# Patient Record
Sex: Male | Born: 1965 | Race: White | Hispanic: No | Marital: Married | Smoking: Current every day smoker
Health system: Southern US, Community
[De-identification: ages and names within clinical notes are randomized; demographics above are authoritative.]

## PROBLEM LIST (undated history)

## (undated) DIAGNOSIS — K219 Gastro-esophageal reflux disease without esophagitis: Secondary | ICD-10-CM

## (undated) DIAGNOSIS — M1990 Unspecified osteoarthritis, unspecified site: Secondary | ICD-10-CM

## (undated) DIAGNOSIS — R600 Localized edema: Secondary | ICD-10-CM

## (undated) DIAGNOSIS — F1010 Alcohol abuse, uncomplicated: Secondary | ICD-10-CM

## (undated) DIAGNOSIS — E119 Type 2 diabetes mellitus without complications: Secondary | ICD-10-CM

## (undated) DIAGNOSIS — I10 Essential (primary) hypertension: Secondary | ICD-10-CM

## (undated) DIAGNOSIS — E871 Hypo-osmolality and hyponatremia: Secondary | ICD-10-CM

## (undated) DIAGNOSIS — G4730 Sleep apnea, unspecified: Secondary | ICD-10-CM

## (undated) DIAGNOSIS — Z720 Tobacco use: Secondary | ICD-10-CM

## (undated) DIAGNOSIS — G8929 Other chronic pain: Secondary | ICD-10-CM

## (undated) HISTORY — DX: Other chronic pain: G89.29

## (undated) HISTORY — DX: Hypo-osmolality and hyponatremia: E87.1

## (undated) HISTORY — PX: BACK SURGERY: SHX140

## (undated) HISTORY — DX: Tobacco use: Z72.0

## (undated) HISTORY — DX: Alcohol abuse, uncomplicated: F10.10

## (undated) HISTORY — DX: Localized edema: R60.0

---

## 1998-01-16 ENCOUNTER — Inpatient Hospital Stay (HOSPITAL_COMMUNITY): Admission: EM | Admit: 1998-01-16 | Discharge: 1998-01-18 | Payer: Self-pay | Admitting: Emergency Medicine

## 2001-07-01 ENCOUNTER — Ambulatory Visit (HOSPITAL_BASED_OUTPATIENT_CLINIC_OR_DEPARTMENT_OTHER): Admission: RE | Admit: 2001-07-01 | Discharge: 2001-07-01 | Payer: Self-pay | Admitting: Family Medicine

## 2001-11-12 ENCOUNTER — Encounter: Admission: RE | Admit: 2001-11-12 | Discharge: 2001-11-12 | Payer: Self-pay | Admitting: Family Medicine

## 2001-11-12 ENCOUNTER — Encounter: Payer: Self-pay | Admitting: Family Medicine

## 2003-09-04 ENCOUNTER — Emergency Department (HOSPITAL_COMMUNITY): Admission: EM | Admit: 2003-09-04 | Discharge: 2003-09-04 | Payer: Self-pay

## 2004-04-12 ENCOUNTER — Ambulatory Visit: Payer: Self-pay | Admitting: Family Medicine

## 2004-04-18 ENCOUNTER — Ambulatory Visit: Payer: Self-pay | Admitting: Gastroenterology

## 2004-11-01 ENCOUNTER — Ambulatory Visit: Payer: Self-pay | Admitting: Family Medicine

## 2014-02-23 ENCOUNTER — Other Ambulatory Visit: Payer: Self-pay | Admitting: Neurosurgery

## 2014-02-27 NOTE — Pre-Procedure Instructions (Signed)
Tyler Wood  02/27/2014   Your procedure is scheduled on:    Wednesday  03/01/14  Report to [facility] Admitting at 6:30 AM.  Call this number if you have problems the morning of surgery: [phone]   Remember:   Do not eat food or drink liquids after midnight.   Take these medicines the morning of surgery with A SIP OF WATER:  GABAPENTIN, OXYCODONE IF NEEDED    STOP ASPIRIN, COUMADIN, PLAVIX, EFFIENT, HERBAL MEDICINES, FISH OIL, MULTIVITAMIN   Do not wear jewelry, make-up or nail polish.  Do not wear lotions, powders, or perfumes. You may wear deodorant.  Do not shave 48 hours prior to surgery. Men may shave face and neck.  Do not bring valuables to the hospital.  [facility] is not responsible for any belongings or valuables.               Contacts, dentures or bridgework may not be worn into surgery.  Leave suitcase in the car. After surgery it may be brought to your room.  For patients admitted to the hospital, discharge time is determined by your treatment team.               Special Instructions: [place] - Preparing for Surgery  Before surgery, you can play an important role.  Because skin is not sterile, your skin needs to be as free of germs as possible.  You can reduce the number of germs on you skin by washing with CHG (chlorahexidine gluconate) soap before surgery.  CHG is an antiseptic cleaner which kills germs and bonds with the skin to continue killing germs even after washing.  Please DO NOT use if you have an allergy to CHG or antibacterial soaps.  If your skin becomes reddened/irritated stop using the CHG and inform your nurse when you arrive at Short Stay.  Do not shave (including legs and underarms) for at least 48 hours prior to the first CHG shower.  You may shave your face.  Please follow these instructions carefully:   1.  Shower with CHG Soap the night before surgery and the morning of Surgery.  2.  If you choose to wash your hair, wash  your hair first as usual with your normal shampoo.  3.  After you shampoo, rinse your hair and body thoroughly to remove the shampoo.  4.  Use CHG as you would any other liquid soap.  You can apply CHG directly to the skin and wash gently with scrungie or a clean washcloth.  5.  Apply the CHG Soap to your body ONLY FROM THE NECK DOWN.  Do not use on open wounds or open sores.  Avoid contact with your eyes, ears, mouth and genitals (private parts).  Wash genitals (private parts) with your normal soap.  6.  Wash thoroughly, paying special attention to the area where your surgery will be performed.  7.  Thoroughly rinse your body with warm water from the neck down.  8.  DO NOT shower/wash with your normal soap after using and rinsing off the CHG Soap.  9.  Pat yourself dry with a clean towel.            10.  Wear clean pajamas.            11.  Place clean sheets on your bed the night of your first shower and do not sleep with pets.  Day of Surgery  Do not apply any lotions the  morning of surgery.  Please wear clean clothes to the hospital/surgery center.      Please read over the following fact sheets that you were given: Pain Booklet, Coughing and Deep Breathing, Blood Transfusion Information, MRSA Information and Surgical Site Infection Prevention

## 2014-02-28 ENCOUNTER — Encounter (HOSPITAL_COMMUNITY)
Admission: RE | Admit: 2014-02-28 | Discharge: 2014-02-28 | Disposition: A | Discharge status: Home / Self Care | Payer: 59 | Source: Ambulatory Visit | Attending: Neurosurgery | Admitting: Neurosurgery

## 2014-02-28 ENCOUNTER — Encounter (HOSPITAL_COMMUNITY): Payer: Self-pay

## 2014-02-28 HISTORY — DX: Sleep apnea, unspecified: G47.30

## 2014-02-28 HISTORY — DX: Unspecified osteoarthritis, unspecified site: M19.90

## 2014-02-28 HISTORY — DX: Essential (primary) hypertension: I10

## 2014-02-28 LAB — CBC
HEMATOCRIT: 41.3 % (ref 39.0–52.0)
HEMOGLOBIN: 14.5 g/dL (ref 13.0–17.0)
MCH: 33.4 pg (ref 26.0–34.0)
MCHC: 35.1 g/dL (ref 30.0–36.0)
MCV: 95.2 fL (ref 78.0–100.0)
Platelets: 185 10*3/uL (ref 150–400)
RBC: 4.34 MIL/uL (ref 4.22–5.81)
RDW: 12.6 % (ref 11.5–15.5)
WBC: 7 10*3/uL (ref 4.0–10.5)

## 2014-02-28 LAB — COMPREHENSIVE METABOLIC PANEL
ALBUMIN: 4.3 g/dL (ref 3.5–5.2)
ALT: 39 U/L (ref 0–53)
ANION GAP: 4 — AB (ref 5–15)
AST: 38 U/L — ABNORMAL HIGH (ref 0–37)
Alkaline Phosphatase: 54 U/L (ref 39–117)
BUN: 14 mg/dL (ref 6–23)
CO2: 27 mmol/L (ref 19–32)
CREATININE: 0.91 mg/dL (ref 0.50–1.35)
Calcium: 9.2 mg/dL (ref 8.4–10.5)
Chloride: 104 mmol/L (ref 96–112)
GFR calc non Af Amer: >90 mL/min (ref >90)
Glucose, Bld: 103 mg/dL — ABNORMAL HIGH (ref 70–99)
Potassium: 4.5 mmol/L (ref 3.5–5.1)
Sodium: 135 mmol/L (ref 135–145)
Total Bilirubin: 0.4 mg/dL (ref 0.3–1.2)
Total Protein: 7 g/dL (ref 6.0–8.3)

## 2014-02-28 LAB — TYPE AND SCREEN
ABO/RH(D): A NEG
Antibody Screen: NEGATIVE

## 2014-02-28 LAB — SURGICAL PCR SCREEN
MRSA, PCR: NEGATIVE
Staphylococcus aureus: NEGATIVE

## 2014-02-28 LAB — ABO/RH: ABO/RH(D): A NEG

## 2014-02-28 MED ORDER — CEFAZOLIN SODIUM-DEXTROSE 2-3 GM-% IV SOLR
2.0000 g | INTRAVENOUS | Status: AC
  Administered 2014-03-01 (×2): 2 g via INTRAVENOUS
  Filled 2014-02-28: qty 50

## 2014-02-28 NOTE — Progress Notes (Addendum)
PCP: Dr. Raeanne [facility]iser at [facility], Denies chest pain, shob, denies having a cardiologist or cardiac test, sleep study greater than 3 years ago and was unsure where it was done at.

## 2014-02-28 NOTE — H&P (Signed)
Tyler Wood is an 49 y.o. male.   Chief Complaint: chronic lumbar pain HPI: patient seen in the past with chronic lumbar pain which is getting worse at to the point than the pain is going to the left giving up on him but now he is having weakness on both. Sitting and flexing the spine does help him  Past Medical History  Diagnosis Date  . Hypertension   . Sleep apnea     Uses CPAP  . Arthritis     back    Past Surgical History  Procedure Laterality Date  . No past surgeries      No family history on file. Social History:  reports that he has been smoking.  He has never used smokeless tobacco. He reports that he drinks about 8.4 oz of alcohol per week. He reports that he does not use illicit drugs.  Allergies: No Known Allergies  No prescriptions prior to admission    Results for orders placed or performed during the hospital encounter of 02/28/14 (from the past 48 hour(s))  Surgical pcr screen     Status: None   Collection Time: 02/28/14  9:41 AM  Result Value Ref Range   MRSA, PCR NEGATIVE NEGATIVE   Staphylococcus aureus NEGATIVE NEGATIVE    Comment:        The Xpert SA Assay (FDA approved for NASAL specimens in patients over 39 years of age), is one component of a comprehensive surveillance program.  Test performance has been validated by [facility] for patients greater than or equal to 21 year old. It is not intended to diagnose infection nor to guide or monitor treatment.   Comprehensive metabolic panel     Status: Abnormal   Collection Time: 02/28/14  9:41 AM  Result Value Ref Range   Sodium 135 135 - 145 mmol/L   Potassium 4.5 3.5 - 5.1 mmol/L   Chloride 104 96 - 112 mmol/L   CO2 27 19 - 32 mmol/L   Glucose, Bld 103 (H) 70 - 99 mg/dL   BUN 14 6 - 23 mg/dL   Creatinine, Ser 0.91 0.50 - 1.35 mg/dL   Calcium 9.2 8.4 - 10.5 mg/dL   Total Protein 7.0 6.0 - 8.3 g/dL   Albumin 4.3 3.5 - 5.2 g/dL   AST 38 (H) 0 - 37 U/L   ALT 39 0 - 53 U/L   Alkaline  Phosphatase 54 39 - 117 U/L   Total Bilirubin 0.4 0.3 - 1.2 mg/dL   GFR calc non Af Amer >90 >90 mL/min   GFR calc Af Amer >90 >90 mL/min    Comment: (NOTE) The eGFR has been calculated using the CKD EPI equation. This calculation has not been validated in all clinical situations. eGFR's persistently <90 mL/min signify possible Chronic Kidney Disease.    Anion gap 4 (L) 5 - 15  CBC     Status: None   Collection Time: 02/28/14  9:41 AM  Result Value Ref Range   WBC 7.0 4.0 - 10.5 K/uL   RBC 4.34 4.22 - 5.81 MIL/uL   Hemoglobin 14.5 13.0 - 17.0 g/dL   HCT 41.3 39.0 - 52.0 %   MCV 95.2 78.0 - 100.0 fL   MCH 33.4 26.0 - 34.0 pg   MCHC 35.1 30.0 - 36.0 g/dL   RDW 12.6 11.5 - 15.5 %   Platelets 185 150 - 400 K/uL  Type and screen     Status: None   Collection Time: 02/28/14  9:46 AM  Result Value Ref Range   ABO/RH(D) A NEG    Antibody Screen NEG    Sample Expiration 03/14/2014   ABO/Rh     Status: None   Collection Time: 02/28/14  9:46 AM  Result Value Ref Range   ABO/RH(D) A NEG    No results found.  Review of Systems  Constitutional: Negative.   HENT: Negative.   Eyes: Negative.   Respiratory: Negative.   Cardiovascular: Negative.   Gastrointestinal: Negative.   Genitourinary: Negative.   Musculoskeletal: Positive for back pain.  Skin: Negative.   Neurological: Positive for sensory change and focal weakness.  Endo/Heme/Allergies: Negative.   Psychiatric/Behavioral: Negative.     There were no vitals taken for this visit. Physical Exam  Hent, nl. Neck, nl. Cv. Nl. Lungs, clear. Abdomen, soft. Extremities, nl. NEURO  slr POSITIVE AT 30 DEGREES. FEMORAL STRETCH maneuver is positive bilaterally. dtr and sensory, nl. Mri lumbar spine shows worsening of the degenerative disease from l3 to s1 when compared with the mri of 2014. Assessment/Plan Patient to go ahead with decompression and fusion with pedicles screws. He and his wife are aware of risks and  benefits  Chiante Peden M 02/28/2014, 7:04 PM

## 2014-03-01 ENCOUNTER — Encounter (HOSPITAL_COMMUNITY): Payer: Self-pay | Admitting: *Deleted

## 2014-03-01 ENCOUNTER — Inpatient Hospital Stay (HOSPITAL_COMMUNITY): Payer: 59

## 2014-03-01 ENCOUNTER — Encounter (HOSPITAL_COMMUNITY)
Admission: RE | Disposition: A | Discharge status: Home Health | Payer: 59 | Source: Ambulatory Visit | Attending: Neurosurgery

## 2014-03-01 ENCOUNTER — Inpatient Hospital Stay (HOSPITAL_COMMUNITY): Payer: 59 | Admitting: Anesthesiology

## 2014-03-01 ENCOUNTER — Inpatient Hospital Stay (HOSPITAL_COMMUNITY)
Admission: RE | Admit: 2014-03-01 | Discharge: 2014-03-04 | DRG: 460 | Disposition: A | Discharge status: Home Health | Payer: 59 | Source: Ambulatory Visit | Attending: Neurosurgery | Admitting: Neurosurgery

## 2014-03-01 DIAGNOSIS — I10 Essential (primary) hypertension: Secondary | ICD-10-CM | POA: Diagnosis present

## 2014-03-01 DIAGNOSIS — F17210 Nicotine dependence, cigarettes, uncomplicated: Secondary | ICD-10-CM | POA: Diagnosis present

## 2014-03-01 DIAGNOSIS — M5116 Intervertebral disc disorders with radiculopathy, lumbar region: Secondary | ICD-10-CM | POA: Diagnosis present

## 2014-03-01 DIAGNOSIS — M545 Low back pain: Secondary | ICD-10-CM | POA: Diagnosis present

## 2014-03-01 DIAGNOSIS — G4730 Sleep apnea, unspecified: Secondary | ICD-10-CM | POA: Diagnosis present

## 2014-03-01 DIAGNOSIS — Z419 Encounter for procedure for purposes other than remedying health state, unspecified: Secondary | ICD-10-CM

## 2014-03-01 DIAGNOSIS — M5136 Other intervertebral disc degeneration, lumbar region: Secondary | ICD-10-CM | POA: Diagnosis present

## 2014-03-01 DIAGNOSIS — M51369 Other intervertebral disc degeneration, lumbar region without mention of lumbar back pain or lower extremity pain: Secondary | ICD-10-CM | POA: Diagnosis present

## 2014-03-01 LAB — BASIC METABOLIC PANEL
Anion gap: 10 (ref 5–15)
BUN: 14 mg/dL (ref 6–23)
CO2: 21 mmol/L (ref 19–32)
Calcium: 7.7 mg/dL — ABNORMAL LOW (ref 8.4–10.5)
Chloride: 106 mmol/L (ref 96–112)
Creatinine, Ser: 1.39 mg/dL — ABNORMAL HIGH (ref 0.50–1.35)
GFR calc Af Amer: 69 mL/min — ABNORMAL LOW (ref >90)
GFR, EST NON AFRICAN AMERICAN: 59 mL/min — AB (ref >90)
GLUCOSE: 149 mg/dL — AB (ref 70–99)
POTASSIUM: 4.6 mmol/L (ref 3.5–5.1)
Sodium: 137 mmol/L (ref 135–145)

## 2014-03-01 LAB — CBC
HCT: 35.8 % — ABNORMAL LOW (ref 39.0–52.0)
HCT: 29.9 % — ABNORMAL LOW (ref 39.0–52.0)
Hemoglobin: 12 g/dL — ABNORMAL LOW (ref 13.0–17.0)
Hemoglobin: 10 g/dL — ABNORMAL LOW (ref 13.0–17.0)
MCH: 32.8 pg (ref 26.0–34.0)
MCH: 32.6 pg (ref 26.0–34.0)
MCHC: 33.5 g/dL (ref 30.0–36.0)
MCHC: 33.4 g/dL (ref 30.0–36.0)
MCV: 97.8 fL (ref 78.0–100.0)
MCV: 97.4 fL (ref 78.0–100.0)
PLATELETS: 160 10*3/uL (ref 150–400)
PLATELETS: 126 10*3/uL — AB (ref 150–400)
RBC: 3.66 MIL/uL — ABNORMAL LOW (ref 4.22–5.81)
RBC: 3.07 MIL/uL — AB (ref 4.22–5.81)
RDW: 13 % (ref 11.5–15.5)
RDW: 13.1 % (ref 11.5–15.5)
WBC: 16.7 10*3/uL — ABNORMAL HIGH (ref 4.0–10.5)
WBC: 10.9 10*3/uL — ABNORMAL HIGH (ref 4.0–10.5)

## 2014-03-01 LAB — PROTIME-INR
INR: 1.21 (ref 0.00–1.49)
Prothrombin Time: 15.4 seconds — ABNORMAL HIGH (ref 11.6–15.2)

## 2014-03-01 LAB — APTT: aPTT: 31 seconds (ref 24–37)

## 2014-03-01 SURGERY — POSTERIOR LUMBAR FUSION 3 LEVEL
Anesthesia: General | Site: Spine Lumbar

## 2014-03-01 MED ORDER — FENTANYL CITRATE 0.05 MG/ML IJ SOLN
INTRAMUSCULAR | Status: AC
  Filled 2014-03-01: qty 5

## 2014-03-01 MED ORDER — PROPOFOL 10 MG/ML IV BOLUS
INTRAVENOUS | Status: DC | PRN
  Administered 2014-03-01: 260 mg via INTRAVENOUS

## 2014-03-01 MED ORDER — SUFENTANIL CITRATE 50 MCG/ML IV SOLN
INTRAVENOUS | Status: AC
  Filled 2014-03-01: qty 1

## 2014-03-01 MED ORDER — FENTANYL CITRATE 0.05 MG/ML IJ SOLN
INTRAMUSCULAR | Status: DC | PRN
  Administered 2014-03-01: 100 ug via INTRAVENOUS
  Administered 2014-03-01: 50 ug via INTRAVENOUS
  Administered 2014-03-01: 100 ug via INTRAVENOUS

## 2014-03-01 MED ORDER — DIAZEPAM 5 MG PO TABS: 5.0000 mg | ORAL_TABLET | Freq: Four times a day (QID) | ORAL | Status: DC | PRN

## 2014-03-01 MED ORDER — VANCOMYCIN HCL 1000 MG IV SOLR
INTRAVENOUS | Status: AC
Start: 2014-03-01 — End: 2014-03-02
  Filled 2014-03-01: qty 1000

## 2014-03-01 MED ORDER — DIPHENHYDRAMINE HCL 50 MG/ML IJ SOLN: 12.5000 mg | Freq: Four times a day (QID) | INTRAMUSCULAR | Status: DC | PRN

## 2014-03-01 MED ORDER — MENTHOL 3 MG MT LOZG: 1.0000 | LOZENGE | OROMUCOSAL | Status: DC | PRN

## 2014-03-01 MED ORDER — SODIUM CHLORIDE 0.9 % IV SOLN
INTRAVENOUS | Status: DC | PRN
  Administered 2014-03-01: 13:00:00 via INTRAVENOUS

## 2014-03-01 MED ORDER — ROCURONIUM BROMIDE 50 MG/5ML IV SOLN
INTRAVENOUS | Status: AC
  Filled 2014-03-01: qty 1

## 2014-03-01 MED ORDER — HYDROMORPHONE HCL 1 MG/ML IJ SOLN
INTRAMUSCULAR | Status: AC
  Filled 2014-03-01: qty 1

## 2014-03-01 MED ORDER — EPHEDRINE SULFATE 50 MG/ML IJ SOLN
INTRAMUSCULAR | Status: AC
Start: 2014-03-01 — End: 2014-03-01
  Filled 2014-03-01: qty 1

## 2014-03-01 MED ORDER — SODIUM CHLORIDE 0.9 % IJ SOLN: 9.0000 mL | INTRAMUSCULAR | Status: DC | PRN

## 2014-03-01 MED ORDER — SODIUM CHLORIDE 0.9 % IV SOLN
INTRAVENOUS | Status: DC
  Administered 2014-03-01 – 2014-03-02 (×3): via INTRAVENOUS

## 2014-03-01 MED ORDER — PHENYLEPHRINE HCL 10 MG/ML IJ SOLN
INTRAMUSCULAR | Status: DC | PRN
  Administered 2014-03-01 (×3): 80 ug via INTRAVENOUS

## 2014-03-01 MED ORDER — BACITRACIN ZINC 500 UNIT/GM EX OINT
TOPICAL_OINTMENT | CUTANEOUS | Status: DC | PRN
  Administered 2014-03-01: 1 via TOPICAL

## 2014-03-01 MED ORDER — LIDOCAINE HCL (CARDIAC) 20 MG/ML IV SOLN
INTRAVENOUS | Status: DC | PRN
  Administered 2014-03-01: 60 mg via INTRAVENOUS

## 2014-03-01 MED ORDER — LACTATED RINGERS IV SOLN
INTRAVENOUS | Status: DC | PRN
  Administered 2014-03-01 (×3): via INTRAVENOUS

## 2014-03-01 MED ORDER — MORPHINE SULFATE (PF) 1 MG/ML IV SOLN
INTRAVENOUS | Status: AC
  Filled 2014-03-01: qty 25

## 2014-03-01 MED ORDER — THROMBIN 20000 UNITS EX SOLR
CUTANEOUS | Status: DC | PRN
  Administered 2014-03-01 (×2): 20 mL via TOPICAL

## 2014-03-01 MED ORDER — ONDANSETRON HCL 4 MG/2ML IJ SOLN: 4.0000 mg | INTRAMUSCULAR | Status: DC | PRN

## 2014-03-01 MED ORDER — PROPOFOL 10 MG/ML IV BOLUS
INTRAVENOUS | Status: AC
  Filled 2014-03-01: qty 20

## 2014-03-01 MED ORDER — SODIUM CHLORIDE 0.9 % IJ SOLN
3.0000 mL | Freq: Two times a day (BID) | INTRAMUSCULAR | Status: DC
  Administered 2014-03-01 – 2014-03-03 (×4): 3 mL via INTRAVENOUS

## 2014-03-01 MED ORDER — DIPHENHYDRAMINE HCL 12.5 MG/5ML PO ELIX: 12.5000 mg | ORAL_SOLUTION | Freq: Four times a day (QID) | ORAL | Status: DC | PRN

## 2014-03-01 MED ORDER — ONDANSETRON HCL 4 MG/2ML IJ SOLN
INTRAMUSCULAR | Status: AC
  Filled 2014-03-01: qty 2

## 2014-03-01 MED ORDER — OXYCODONE-ACETAMINOPHEN 5-325 MG PO TABS
ORAL_TABLET | ORAL | Status: AC
  Administered 2014-03-01: 2 via ORAL
  Filled 2014-03-01: qty 2

## 2014-03-01 MED ORDER — SUCCINYLCHOLINE CHLORIDE 20 MG/ML IJ SOLN
INTRAMUSCULAR | Status: AC
  Filled 2014-03-01: qty 1

## 2014-03-01 MED ORDER — DIAZEPAM 5 MG/ML IJ SOLN
INTRAMUSCULAR | Status: AC
  Administered 2014-03-01: 2.5 mg via INTRAVENOUS
  Filled 2014-03-01: qty 2

## 2014-03-01 MED ORDER — ALBUMIN HUMAN 5 % IV SOLN
INTRAVENOUS | Status: DC | PRN
  Administered 2014-03-01 (×4): via INTRAVENOUS

## 2014-03-01 MED ORDER — SODIUM CHLORIDE 0.9 % IJ SOLN
INTRAMUSCULAR | Status: AC
  Filled 2014-03-01: qty 10

## 2014-03-01 MED ORDER — MORPHINE SULFATE (PF) 1 MG/ML IV SOLN
INTRAVENOUS | Status: DC
  Administered 2014-03-01: 16:00:00 via INTRAVENOUS
  Administered 2014-03-01: 12.23 mg via INTRAVENOUS
  Administered 2014-03-02: 12 mg via INTRAVENOUS
  Administered 2014-03-02: 12.23 mg via INTRAVENOUS
  Administered 2014-03-02: 15 mg via INTRAVENOUS
  Administered 2014-03-02: 16:00:00 via INTRAVENOUS
  Administered 2014-03-02: 12.23 mg via INTRAVENOUS
  Administered 2014-03-02: 6 mg via INTRAVENOUS
  Administered 2014-03-03: 06:00:00 via INTRAVENOUS
  Administered 2014-03-03: 6 mg via INTRAVENOUS
  Administered 2014-03-03: 7.5 mg via INTRAVENOUS
  Filled 2014-03-01 (×5): qty 25

## 2014-03-01 MED ORDER — SODIUM CHLORIDE 0.9 % IV SOLN: 4.0000 ug/kg/min | INTRAVENOUS | Status: DC

## 2014-03-01 MED ORDER — VANCOMYCIN HCL 1000 MG IV SOLR
INTRAVENOUS | Status: DC | PRN
  Administered 2014-03-01: 1000 mg via TOPICAL

## 2014-03-01 MED ORDER — ONDANSETRON HCL 4 MG/2ML IJ SOLN
INTRAMUSCULAR | Status: DC | PRN
  Administered 2014-03-01: 4 mg via INTRAVENOUS

## 2014-03-01 MED ORDER — BUPIVACAINE LIPOSOME 1.3 % IJ SUSP
INTRAMUSCULAR | Status: DC | PRN
  Administered 2014-03-01: 20 mL

## 2014-03-01 MED ORDER — ROCURONIUM BROMIDE 100 MG/10ML IV SOLN
INTRAVENOUS | Status: DC | PRN
  Administered 2014-03-01: 20 mg via INTRAVENOUS
  Administered 2014-03-01 (×6): 10 mg via INTRAVENOUS
  Administered 2014-03-01: 20 mg via INTRAVENOUS
  Administered 2014-03-01: 50 mg via INTRAVENOUS
  Administered 2014-03-01: 20 mg via INTRAVENOUS
  Administered 2014-03-01 (×2): 10 mg via INTRAVENOUS

## 2014-03-01 MED ORDER — DIAZEPAM 5 MG/ML IJ SOLN
2.5000 mg | INTRAMUSCULAR | Status: AC | PRN
  Administered 2014-03-01 (×2): 2.5 mg via INTRAVENOUS

## 2014-03-01 MED ORDER — SODIUM CHLORIDE 0.9 % IV SOLN
4.0000 ug/kg/min | INTRAVENOUS | Status: DC
  Filled 2014-03-01: qty 50

## 2014-03-01 MED ORDER — ACETAMINOPHEN 325 MG PO TABS
650.0000 mg | ORAL_TABLET | ORAL | Status: DC | PRN
  Administered 2014-03-02 – 2014-03-03 (×2): 650 mg via ORAL
  Filled 2014-03-01 (×2): qty 2

## 2014-03-01 MED ORDER — DIAZEPAM 5 MG PO TABS
10.0000 mg | ORAL_TABLET | Freq: Four times a day (QID) | ORAL | Status: DC | PRN
  Administered 2014-03-02 – 2014-03-04 (×5): 10 mg via ORAL
  Filled 2014-03-01 (×5): qty 2

## 2014-03-01 MED ORDER — HYDROMORPHONE HCL 1 MG/ML IJ SOLN: 0.2500 mg | INTRAMUSCULAR | Status: DC | PRN

## 2014-03-01 MED ORDER — PROMETHAZINE HCL 25 MG/ML IJ SOLN: 6.2500 mg | INTRAMUSCULAR | Status: DC | PRN

## 2014-03-01 MED ORDER — NALOXONE HCL 0.4 MG/ML IJ SOLN: 0.4000 mg | INTRAMUSCULAR | Status: DC | PRN

## 2014-03-01 MED ORDER — MIDAZOLAM HCL 2 MG/2ML IJ SOLN
INTRAMUSCULAR | Status: AC
  Filled 2014-03-01: qty 2

## 2014-03-01 MED ORDER — 0.9 % SODIUM CHLORIDE (POUR BTL) OPTIME
TOPICAL | Status: DC | PRN
  Administered 2014-03-01 (×2): 1000 mL

## 2014-03-01 MED ORDER — GABAPENTIN 600 MG PO TABS
600.0000 mg | ORAL_TABLET | Freq: Three times a day (TID) | ORAL | Status: DC
  Administered 2014-03-01 – 2014-03-04 (×8): 600 mg via ORAL
  Filled 2014-03-01 (×9): qty 1

## 2014-03-01 MED ORDER — SODIUM CHLORIDE 0.9 % IJ SOLN: 3.0000 mL | INTRAMUSCULAR | Status: DC | PRN

## 2014-03-01 MED ORDER — CEFAZOLIN SODIUM 1-5 GM-% IV SOLN
1.0000 g | Freq: Three times a day (TID) | INTRAVENOUS | Status: AC
  Administered 2014-03-01 – 2014-03-02 (×2): 1 g via INTRAVENOUS
  Filled 2014-03-01 (×3): qty 50

## 2014-03-01 MED ORDER — SODIUM CHLORIDE 0.9 % IV SOLN
250.0000 mL | INTRAVENOUS | Status: DC
  Administered 2014-03-01: 250 mL via INTRAVENOUS

## 2014-03-01 MED ORDER — NEOSTIGMINE METHYLSULFATE 10 MG/10ML IV SOLN
INTRAVENOUS | Status: AC
  Filled 2014-03-01: qty 1

## 2014-03-01 MED ORDER — MIDAZOLAM HCL 5 MG/5ML IJ SOLN
INTRAMUSCULAR | Status: DC | PRN
  Administered 2014-03-01: 1 mg via INTRAVENOUS
  Administered 2014-03-01: 2 mg via INTRAVENOUS
  Administered 2014-03-01: 1 mg via INTRAVENOUS

## 2014-03-01 MED ORDER — PHENYLEPHRINE HCL 10 MG/ML IJ SOLN
10.0000 mg | INTRAVENOUS | Status: DC | PRN
  Administered 2014-03-01: 20 ug/min via INTRAVENOUS

## 2014-03-01 MED ORDER — ONDANSETRON HCL 4 MG/2ML IJ SOLN: 4.0000 mg | Freq: Four times a day (QID) | INTRAMUSCULAR | Status: DC | PRN

## 2014-03-01 MED ORDER — NEOSTIGMINE METHYLSULFATE 10 MG/10ML IV SOLN
INTRAVENOUS | Status: DC | PRN
  Administered 2014-03-01: 3 mg via INTRAVENOUS

## 2014-03-01 MED ORDER — ROCURONIUM BROMIDE 50 MG/5ML IV SOLN
INTRAVENOUS | Status: AC
  Filled 2014-03-01: qty 2

## 2014-03-01 MED ORDER — GLYCOPYRROLATE 0.2 MG/ML IJ SOLN
INTRAMUSCULAR | Status: DC | PRN
  Administered 2014-03-01: 0.2 mg via INTRAVENOUS

## 2014-03-01 MED ORDER — SUFENTANIL CITRATE 50 MCG/ML IV SOLN
INTRAVENOUS | Status: DC | PRN
  Administered 2014-03-01: 10 ug via INTRAVENOUS
  Administered 2014-03-01: 5 ug via INTRAVENOUS
  Administered 2014-03-01 (×2): 10 ug via INTRAVENOUS
  Administered 2014-03-01: 20 ug via INTRAVENOUS
  Administered 2014-03-01 (×2): 10 ug via INTRAVENOUS
  Administered 2014-03-01 (×2): 5 ug via INTRAVENOUS
  Administered 2014-03-01 (×3): 10 ug via INTRAVENOUS
  Administered 2014-03-01 (×2): 5 ug via INTRAVENOUS
  Administered 2014-03-01 (×4): 10 ug via INTRAVENOUS
  Administered 2014-03-01: 5 ug via INTRAVENOUS
  Administered 2014-03-01 (×2): 10 ug via INTRAVENOUS
  Administered 2014-03-01 (×2): 5 ug via INTRAVENOUS

## 2014-03-01 MED ORDER — FUROSEMIDE 10 MG/ML IJ SOLN
INTRAMUSCULAR | Status: DC | PRN
  Administered 2014-03-01: 10 mg via INTRAMUSCULAR
  Administered 2014-03-01: 20 mg via INTRAMUSCULAR
  Administered 2014-03-01: 10 mg via INTRAMUSCULAR

## 2014-03-01 MED ORDER — OXYCODONE-ACETAMINOPHEN 5-325 MG PO TABS
2.0000 | ORAL_TABLET | ORAL | Status: DC | PRN
  Administered 2014-03-01 – 2014-03-03 (×6): 2 via ORAL
  Filled 2014-03-01 (×6): qty 2

## 2014-03-01 MED ORDER — BUPIVACAINE LIPOSOME 1.3 % IJ SUSP
20.0000 mL | INTRAMUSCULAR | Status: AC
  Filled 2014-03-01: qty 20

## 2014-03-01 MED ORDER — GLYCOPYRROLATE 0.2 MG/ML IJ SOLN
INTRAMUSCULAR | Status: AC
  Filled 2014-03-01: qty 2

## 2014-03-01 MED ORDER — PHENOL 1.4 % MT LIQD: 1.0000 | OROMUCOSAL | Status: DC | PRN

## 2014-03-01 MED ORDER — PHENYLEPHRINE 40 MCG/ML (10ML) SYRINGE FOR IV PUSH (FOR BLOOD PRESSURE SUPPORT)
PREFILLED_SYRINGE | INTRAVENOUS | Status: AC
  Filled 2014-03-01: qty 10

## 2014-03-01 MED ORDER — ACETAMINOPHEN 650 MG RE SUPP: 650.0000 mg | RECTAL | Status: DC | PRN

## 2014-03-01 MED ORDER — LIDOCAINE HCL (CARDIAC) 20 MG/ML IV SOLN
INTRAVENOUS | Status: AC
  Filled 2014-03-01: qty 5

## 2014-03-01 MED ORDER — ARTIFICIAL TEARS OP OINT
TOPICAL_OINTMENT | OPHTHALMIC | Status: AC
  Filled 2014-03-01: qty 3.5

## 2014-03-01 MED ORDER — LISINOPRIL 20 MG PO TABS
20.0000 mg | ORAL_TABLET | Freq: Every day | ORAL | Status: DC
  Administered 2014-03-03 – 2014-03-04 (×2): 20 mg via ORAL
  Filled 2014-03-01 (×3): qty 1

## 2014-03-01 MED ORDER — FUROSEMIDE 10 MG/ML IJ SOLN
INTRAMUSCULAR | Status: AC
  Filled 2014-03-01: qty 4

## 2014-03-01 SURGICAL SUPPLY — 77 items
APL SKNCLS STERI-STRIP NONHPOA (GAUZE/BANDAGES/DRESSINGS)
BENZOIN TINCTURE PRP APPL 2/3 (GAUZE/BANDAGES/DRESSINGS) IMPLANT
BLADE BN FN 3.2XSTRL LF (MISCELLANEOUS) ×1 IMPLANT
BLADE BONE MILL FINE (MISCELLANEOUS) ×2
BLADE CLIPPER SURG (BLADE) IMPLANT
BUR ACORN 6.0 (BURR) ×2 IMPLANT
BUR MATCHSTICK NEURO 3.0 LAGG (BURR) ×2 IMPLANT
CANISTER SUCT 3000ML PPV (MISCELLANEOUS) ×2 IMPLANT
CAP LOCKING THREADED (Cap) ×16 IMPLANT
CONT SPEC 4OZ CLIKSEAL STRL BL (MISCELLANEOUS) ×4 IMPLANT
CORDS BIPOLAR (ELECTRODE) ×2 IMPLANT
COVER BACK TABLE 60X90IN (DRAPES) ×2 IMPLANT
DRAPE C-ARM 42X72 X-RAY (DRAPES) ×8 IMPLANT
DRAPE LAPAROTOMY 100X72X124 (DRAPES) ×2 IMPLANT
DRAPE POUCH INSTRU U-SHP 10X18 (DRAPES) ×2 IMPLANT
DRSG PAD ABDOMINAL 8X10 ST (GAUZE/BANDAGES/DRESSINGS) ×2 IMPLANT
DURAPREP 26ML APPLICATOR (WOUND CARE) ×2 IMPLANT
ELECT REM PT RETURN 9FT ADLT (ELECTROSURGICAL) ×2
ELECTRODE REM PT RTRN 9FT ADLT (ELECTROSURGICAL) ×1 IMPLANT
EVACUATOR 3/16  PVC DRAIN (DRAIN) ×1
EVACUATOR 3/16 PVC DRAIN (DRAIN) ×1 IMPLANT
GAUZE SPONGE 4X4 12PLY STRL (GAUZE/BANDAGES/DRESSINGS) ×2 IMPLANT
GAUZE SPONGE 4X4 16PLY XRAY LF (GAUZE/BANDAGES/DRESSINGS) ×4 IMPLANT
GLOVE BIO SURGEON STRL SZ7 (GLOVE) ×4 IMPLANT
GLOVE BIO SURGEON STRL SZ8.5 (GLOVE) ×6 IMPLANT
GLOVE BIOGEL M 8.0 STRL (GLOVE) ×4 IMPLANT
GLOVE BIOGEL PI IND STRL 7.5 (GLOVE) ×2 IMPLANT
GLOVE BIOGEL PI IND STRL 8 (GLOVE) ×2 IMPLANT
GLOVE BIOGEL PI INDICATOR 7.5 (GLOVE) ×2
GLOVE BIOGEL PI INDICATOR 8 (GLOVE) ×2
GLOVE ECLIPSE 7.5 STRL STRAW (GLOVE) ×8 IMPLANT
GLOVE EXAM NITRILE LRG STRL (GLOVE) IMPLANT
GLOVE EXAM NITRILE MD LF STRL (GLOVE) IMPLANT
GLOVE EXAM NITRILE XL STR (GLOVE) IMPLANT
GLOVE EXAM NITRILE XS STR PU (GLOVE) IMPLANT
GLOVE SS BIOGEL STRL SZ 8 (GLOVE) ×4 IMPLANT
GLOVE SUPERSENSE BIOGEL SZ 8 (GLOVE) ×4
GOWN STRL REUS W/ TWL LRG LVL3 (GOWN DISPOSABLE) ×1 IMPLANT
GOWN STRL REUS W/ TWL XL LVL3 (GOWN DISPOSABLE) ×6 IMPLANT
GOWN STRL REUS W/TWL 2XL LVL3 (GOWN DISPOSABLE) ×4 IMPLANT
GOWN STRL REUS W/TWL LRG LVL3 (GOWN DISPOSABLE) ×2
GOWN STRL REUS W/TWL XL LVL3 (GOWN DISPOSABLE) ×12
KIT BASIN OR (CUSTOM PROCEDURE TRAY) ×2 IMPLANT
KIT INFUSE LRG II (Orthopedic Implant) ×2 IMPLANT
KIT ROOM TURNOVER OR (KITS) ×2 IMPLANT
NEEDLE HYPO 18GX1.5 BLUNT FILL (NEEDLE) IMPLANT
NEEDLE HYPO 21X1.5 SAFETY (NEEDLE) ×2 IMPLANT
NEEDLE HYPO 25X1 1.5 SAFETY (NEEDLE) ×2 IMPLANT
NS IRRIG 1000ML POUR BTL (IV SOLUTION) ×4 IMPLANT
PACK LAMINECTOMY NEURO (CUSTOM PROCEDURE TRAY) ×2 IMPLANT
PAD ARMBOARD 7.5X6 YLW CONV (MISCELLANEOUS) ×10 IMPLANT
PATTIES SURGICAL .5 X1 (DISPOSABLE) ×2 IMPLANT
PATTIES SURGICAL .5 X3 (DISPOSABLE) IMPLANT
PATTIES SURGICAL 1X1 (DISPOSABLE) ×2 IMPLANT
ROD CREO 100MM SPINAL (Rod) ×4 IMPLANT
SCREW 6.5X45 (Screw) ×14 IMPLANT
SCREW CREO THRD POLY 6.5X55MM (Screw) ×2 IMPLANT
SPACER CROSSLINK 58-70 (Spacer) ×2 IMPLANT
SPACER RISE 8X22 11-17MM-15 (Spacer) ×4 IMPLANT
SPACER RISE 8X22 8-14MM-10 (Neuro Prosthesis/Implant) ×4 IMPLANT
SPONGE LAP 4X18 X RAY DECT (DISPOSABLE) IMPLANT
SPONGE NEURO XRAY DETECT 1X3 (DISPOSABLE) IMPLANT
SPONGE SURGIFOAM ABS GEL 100 (HEMOSTASIS) ×4 IMPLANT
STRIP CLOSURE SKIN 1/2X4 (GAUZE/BANDAGES/DRESSINGS) IMPLANT
SUT VIC AB 1 CT1 18XBRD ANBCTR (SUTURE) ×2 IMPLANT
SUT VIC AB 1 CT1 8-18 (SUTURE) ×4
SUT VIC AB 2-0 CP2 18 (SUTURE) ×4 IMPLANT
SUT VIC AB 3-0 SH 8-18 (SUTURE) ×4 IMPLANT
SYR 20CC LL (SYRINGE) ×2 IMPLANT
SYR 20ML ECCENTRIC (SYRINGE) ×2 IMPLANT
SYR 5ML LL (SYRINGE) IMPLANT
TAPE CLOTH SURG 4X10 WHT LF (GAUZE/BANDAGES/DRESSINGS) ×2 IMPLANT
TOWEL OR 17X24 6PK STRL BLUE (TOWEL DISPOSABLE) ×2 IMPLANT
TOWEL OR 17X26 10 PK STRL BLUE (TOWEL DISPOSABLE) ×2 IMPLANT
TRAY FOLEY CATH 14FRSI W/METER (CATHETERS) IMPLANT
TRAY FOLEY CATH 16FRSI W/METER (SET/KITS/TRAYS/PACK) ×2 IMPLANT
WATER STERILE IRR 1000ML POUR (IV SOLUTION) ×2 IMPLANT

## 2014-03-01 NOTE — Progress Notes (Signed)
Called MD about BP 80/50 and gave orders for STAT CBC and 1 liter bolus over 3 hours. Will continue to monitor. Suzy Bouchardhompson, Joseth Weigel E, [place]RN  03/01/2014 2130

## 2014-03-01 NOTE — Transfer of Care (Signed)
Immediate Anesthesia Transfer of Care Note  Patient: Tyler Wood  Procedure(s) Performed: Procedure(s): LUMBAR THREE-FOUR,LUMBAR FOUR-FIVE,LUMBARFIVE-SACRAL ONE DECOMPRESSION/FUSION (N/A)  Patient Location: PACU  Anesthesia Type:General  Level of Consciousness: awake, oriented and patient cooperative  Airway & Oxygen Therapy: Patient Spontanous Breathing and Patient connected to face mask oxygen  Post-op Assessment: Report given to RN, Post -op Vital signs reviewed and stable and Patient moving all extremities  Post vital signs: Reviewed and stable  Last Vitals:  Filed Vitals:   03/01/14 0637  BP: 146/89  Pulse: 83  Temp: 36.1 C    Complications: No apparent anesthesia complications

## 2014-03-01 NOTE — Anesthesia Preprocedure Evaluation (Addendum)
Anesthesia Evaluation  Patient identified by MRN, date of birth, ID band Patient awake    Reviewed: Allergy & Precautions, NPO status , Patient's Chart, lab work & pertinent test results  Airway Mallampati: II  TM Distance: >3 FB Neck ROM: Full    Dental   Pulmonary sleep apnea , Current Smoker,  breath sounds clear to auscultation        Cardiovascular hypertension, Rhythm:Regular Rate:Normal     Neuro/Psych    GI/Hepatic negative GI ROS, Neg liver ROS,   Endo/Other  negative endocrine ROS  Renal/GU negative Renal ROS     Musculoskeletal  (+) Arthritis -,   Abdominal   Peds  Hematology   Anesthesia Other Findings   Reproductive/Obstetrics                            Anesthesia Physical Anesthesia Plan  ASA: III  Anesthesia Plan: General   Post-op Pain Management:    Induction: Intravenous  Airway Management Planned: Oral ETT  Additional Equipment:   Intra-op Plan:   Post-operative Plan: Extubation in OR  Informed Consent: I have reviewed the patients History and Physical, chart, labs and discussed the procedure including the risks, benefits and alternatives for the proposed anesthesia with the patient or authorized representative who has indicated his/her understanding and acceptance.   Dental advisory given  Plan Discussed with: CRNA, Anesthesiologist and Surgeon  Anesthesia Plan Comments:        Anesthesia Quick Evaluation

## 2014-03-01 NOTE — Progress Notes (Signed)
Patient ID: Tyler Wood, male   DOB: 1965/03/01, 49 y.o.   MRN: [id number] C/o incisional pain. No weakness . To get a cbc in am

## 2014-03-01 NOTE — Progress Notes (Signed)
Called MD back about BP 89/59 after 1 liter bolus and since CBC checked out okay that he said to continue to monitor. Melina Schoolshompson, Akhil Piscopo E, RN 03/01/2014 11:42 PM

## 2014-03-01 NOTE — Anesthesia Procedure Notes (Signed)
Procedure Name: Intubation Date/Time: 03/01/2014 8:40 AM Performed by: Coralee RudFLORES, Doralene Glanz Pre-anesthesia Checklist: Patient identified, Emergency Drugs available, Suction available, Patient being monitored and Timeout performed Patient Re-evaluated:Patient Re-evaluated prior to inductionOxygen Delivery Method: Circle system utilized Preoxygenation: Pre-oxygenation with 100% oxygen Intubation Type: IV induction Laryngoscope Size: Miller and 3 Grade View: Grade III Tube type: Oral Tube size: 8.0 mm Number of attempts: 1 Airway Equipment and Method: Stylet Placement Confirmation: ETT inserted through vocal cords under direct vision,  positive ETCO2 and breath sounds checked- equal and bilateral Secured at: 23 cm Tube secured with: Tape Dental Injury: Teeth and Oropharynx as per pre-operative assessment  Future Recommendations: Recommend- induction with short-acting agent, and alternative techniques readily available

## 2014-03-01 NOTE — Anesthesia Postprocedure Evaluation (Signed)
  Anesthesia Post-op Note  Patient: Tyler Wood  Procedure(s) Performed: Procedure(s): LUMBAR THREE-FOUR,LUMBAR FOUR-FIVE,LUMBARFIVE-SACRAL ONE DECOMPRESSION/FUSION (N/A)  Patient Location: PACU  Anesthesia Type:General  Level of Consciousness: awake  Airway and Oxygen Therapy: Patient Spontanous Breathing  Post-op Pain: mild  Post-op Assessment: Post-op Vital signs reviewed  Post-op Vital Signs: Reviewed  Last Vitals:  Filed Vitals:   03/01/14 0637  BP: 146/89  Pulse: 83  Temp: 36.1 C    Complications: No apparent anesthesia complications

## 2014-03-02 LAB — POCT I-STAT 4, (NA,K, GLUC, HGB,HCT)
GLUCOSE: 121 mg/dL — AB (ref 70–99)
Glucose, Bld: 132 mg/dL — ABNORMAL HIGH (ref 70–99)
Glucose, Bld: 123 mg/dL — ABNORMAL HIGH (ref 70–99)
HCT: 34 % — ABNORMAL LOW (ref 39.0–52.0)
HCT: 33 % — ABNORMAL LOW (ref 39.0–52.0)
HEMATOCRIT: 36 % — AB (ref 39.0–52.0)
Hemoglobin: 12.2 g/dL — ABNORMAL LOW (ref 13.0–17.0)
Hemoglobin: 11.6 g/dL — ABNORMAL LOW (ref 13.0–17.0)
Hemoglobin: 11.2 g/dL — ABNORMAL LOW (ref 13.0–17.0)
POTASSIUM: 6.5 mmol/L — AB (ref 3.5–5.1)
POTASSIUM: 6.4 mmol/L — AB (ref 3.5–5.1)
Potassium: 6.5 mmol/L (ref 3.5–5.1)
SODIUM: 136 mmol/L (ref 135–145)
SODIUM: 136 mmol/L (ref 135–145)
Sodium: 135 mmol/L (ref 135–145)

## 2014-03-02 LAB — CBC WITH DIFFERENTIAL/PLATELET
BASOS PCT: 0 % (ref 0–1)
Basophils Absolute: 0 10*3/uL (ref 0.0–0.1)
EOS PCT: 0 % (ref 0–5)
Eosinophils Absolute: 0 10*3/uL (ref 0.0–0.7)
HCT: 28.7 % — ABNORMAL LOW (ref 39.0–52.0)
Hemoglobin: 9.7 g/dL — ABNORMAL LOW (ref 13.0–17.0)
Lymphocytes Relative: 12 % (ref 12–46)
Lymphs Abs: 1.5 10*3/uL (ref 0.7–4.0)
MCH: 32.8 pg (ref 26.0–34.0)
MCHC: 33.8 g/dL (ref 30.0–36.0)
MCV: 97 fL (ref 78.0–100.0)
MONOS PCT: 14 % — AB (ref 3–12)
Monocytes Absolute: 1.8 10*3/uL — ABNORMAL HIGH (ref 0.1–1.0)
NEUTROS PCT: 74 % (ref 43–77)
Neutro Abs: 9.5 10*3/uL — ABNORMAL HIGH (ref 1.7–7.7)
Platelets: 116 10*3/uL — ABNORMAL LOW (ref 150–400)
RBC: 2.96 MIL/uL — ABNORMAL LOW (ref 4.22–5.81)
RDW: 13 % (ref 11.5–15.5)
WBC: 12.8 10*3/uL — ABNORMAL HIGH (ref 4.0–10.5)

## 2014-03-02 MED ORDER — POLYETHYLENE GLYCOL 3350 17 G PO PACK: 17.0000 g | PACK | Freq: Every day | ORAL | Status: DC | PRN

## 2014-03-02 MED ORDER — SENNA 8.6 MG PO TABS
1.0000 | ORAL_TABLET | Freq: Two times a day (BID) | ORAL | Status: DC
  Administered 2014-03-02 – 2014-03-04 (×5): 8.6 mg via ORAL
  Filled 2014-03-02 (×5): qty 1

## 2014-03-02 MED ORDER — DOCUSATE SODIUM 100 MG PO CAPS
100.0000 mg | ORAL_CAPSULE | Freq: Two times a day (BID) | ORAL | Status: DC
  Administered 2014-03-02 – 2014-03-04 (×5): 100 mg via ORAL
  Filled 2014-03-02 (×5): qty 1

## 2014-03-02 MED FILL — Heparin Sodium (Porcine) Inj 1000 Unit/ML: INTRAMUSCULAR | Qty: 30 | Status: AC

## 2014-03-02 MED FILL — Sodium Chloride IV Soln 0.9%: INTRAVENOUS | Qty: 3000 | Status: AC

## 2014-03-02 NOTE — Progress Notes (Signed)
Patient ID: Tyler Wood, male   DOB: [birth year], 49 y.o.   MRN: [id number] Stable, slept off and on. No weakness. Pt /ot tohelp. Continue in analgesia.

## 2014-03-02 NOTE — Evaluation (Signed)
Occupational Therapy Evaluation Patient Details Name: RAYSON RANDO MRN: [id number] DOB: [birth year] Today's Date: 03/02/2014    History of Present Illness Patient is a 49 y/o male s/p L3-4, 4-5, L5-S1 decompression/fusion.    Clinical Impression   Pt admitted with the above diagnosis and has the deficits listed below. Pt would benefit from cont OT to increase safety and I with adls and adl mobility so he can d/c home with his wife.     Follow Up Recommendations  Home health OT;Supervision/Assistance - 24 hour    Equipment Recommendations  3 in 1 bedside comode    Recommendations for Other Services       Precautions / Restrictions Precautions Precautions: Back;Fall Precaution Booklet Issued: No Precaution Comments: reviewed precautions. Able to recall 2/3 at end of session, 3/3 with assist of wife. Required Braces or Orthoses: Spinal Brace Spinal Brace: Lumbar corset Restrictions Weight Bearing Restrictions: No      Mobility Bed Mobility Overal bed mobility: Needs Assistance Bed Mobility: Rolling;Sidelying to Sit Rolling: Min guard Sidelying to sit: Min guard       General bed mobility comments: Pt up on arrival.  Transfers Overall transfer level: Needs assistance Equipment used: Rolling walker (2 wheeled) Transfers: Sit to/from Stand Sit to Stand: Min assist;From elevated surface         General transfer comment: Min A to stand from EOB x2 from elevated surface. Cues for anterior translation and hand placement.    Balance Overall balance assessment: Needs assistance Sitting-balance support: No upper extremity supported;Feet supported Sitting balance-Leahy Scale: Fair Sitting balance - Comments: Able to donn brace with setup.   Standing balance support: Bilateral upper extremity supported;During functional activity Standing balance-Leahy Scale: Fair Standing balance comment: pt able to let go of outside support while grooming for short spurts of time.                             ADL Overall ADL's : Needs assistance/impaired Eating/Feeding: Independent;Sitting   Grooming: Wash/dry hands;Wash/dry face;Oral care;Min guard;Standing Grooming Details (indicate cue type and reason): did not stand long due to HR in 150s Upper Body Bathing: Set up;Sitting   Lower Body Bathing: Maximal assistance;Sit to/from stand;Cueing for back precautions Lower Body Bathing Details (indicate cue type and reason): assist to reach below knees. Pt cannot cross legs. Upper Body Dressing : Set up;Sitting Upper Body Dressing Details (indicate cue type and reason): cues for corsett Lower Body Dressing: Maximal assistance;Sit to/from stand;Cueing for back precautions Lower Body Dressing Details (indicate cue type and reason): Pt needs assist with pants, socks and shoes. Pt introduced to all AE to assist with LE dressing.  Pt knows to get this equipment in the gift store. Toilet Transfer: Minimal assistance;Ambulation;Grab bars;Comfort height toilet;RW Statistician Details (indicate cue type and reason): Pt needs 3:1 and cues for hand placement Toileting- Clothing Manipulation and Hygiene: Minimal assistance;Sit to/from stand Toileting - Clothing Manipulation Details (indicate cue type and reason): cues to avoid twisting Tub/ Shower Transfer: Tub transfer (only verbally discussed since HR above 140)   Functional mobility during ADLs: Minimal assistance;Rolling walker General ADL Comments: Pt limited today due to HR being above 140 at rest.  Pt unable to cross legs to donn socks and shoes so therefore would benfit from AE and a trial at a tub transfer next session.     Vision     Perception     Praxis  Pertinent Vitals/Pain Pain Assessment: 0-10 Pain Score: 7  Pain Location: back at all times Pain Descriptors / Indicators: Aching;Sore Pain Intervention(s): Limited activity within patient's tolerance;Monitored during session;Repositioned      Hand Dominance Right   Extremity/Trunk Assessment Upper Extremity Assessment Upper Extremity Assessment: Overall WFL for tasks assessed   Lower Extremity Assessment Lower Extremity Assessment: Defer to PT evaluation LLE Sensation: decreased light touch   Cervical / Trunk Assessment Cervical / Trunk Assessment: Normal   Communication Communication Communication: No difficulties   Cognition Arousal/Alertness: Awake/alert Behavior During Therapy: WFL for tasks assessed/performed Overall Cognitive Status: Within Functional Limits for tasks assessed       Memory: Decreased recall of precautions             General Comments       Exercises       Shoulder Instructions      Home Living Family/patient expects to be discharged to:: Private residence Living Arrangements: Spouse/significant other Available Help at Discharge: Family;Available 24 hours/day Type of Home: House Home Access: Stairs to enter Entergy CorporationEntrance Stairs-Number of Steps: 3 Entrance Stairs-Rails: Right Home Layout: Two level;Able to live on main level with bedroom/bathroom     Bathroom Shower/Tub: Tub/shower unit;Curtain Shower/tub characteristics: Engineer, building servicesCurtain Bathroom Toilet: Standard     Home Equipment: None          Prior Functioning/Environment Level of Independence: Independent        Comments: [place] cabinets [place]    OT Diagnosis: Generalized weakness;Acute pain   OT Problem List: Decreased activity tolerance;Impaired balance (sitting and/or standing);Decreased knowledge of use of DME or AE;Decreased knowledge of precautions;Pain   OT Treatment/Interventions: Self-care/ADL training;DME and/or AE instruction;Therapeutic activities    OT Goals(Current goals can be found in the care plan section) Acute Rehab OT Goals Patient Stated Goal: to return home OT Goal Formulation: With patient/family Time For Goal Achievement: 03/09/14 Potential to Achieve  Goals: Good ADL Goals Pt Will Perform Grooming: with modified independence;standing Pt Will Perform Lower Body Bathing: with supervision;with adaptive equipment;sit to/from stand Pt Will Perform Lower Body Dressing: with supervision;with adaptive equipment;sit to/from stand Pt Will Perform Tub/Shower Transfer: with min assist;3 in 1;rolling walker;Tub transfer Additional ADL Goal #1: Pt will complete all toileting tasks on 3:1 over commode with S.  OT Frequency: Min 2X/week   Barriers to D/C:            Co-evaluation              End of Session Equipment Utilized During Treatment: Rolling walker Nurse Communication: Mobility status  Activity Tolerance: Other (comment) (limited by high HR) Patient left: in chair;with call bell/phone within reach;with family/visitor present   Time: 1215-1245 OT Time Calculation (min): 30 min Charges:  OT General Charges $OT Visit: 1 Procedure OT Evaluation $Initial OT Evaluation Tier I: 1 Procedure OT Treatments $Self Care/Home Management : 8-22 mins G-Codes:    Hope BuddsJones, Knut Rondinelli Anne 03/02/2014, 12:57 PM  [phone]

## 2014-03-02 NOTE — Progress Notes (Signed)
Spoke with Lovell SheehanJenkins about high heart rate around 135 and BUE numbness and in LLE. Will do EKG and report if abnormal. Janee Mornhompson, Tamira Ryland E, RN 03/02/2014 3:49 AM

## 2014-03-02 NOTE — Clinical Social Work Note (Signed)
CSW consult acknowledged:  Clinical Social Worker received a consult in regards to SNF placement. PT is currently recommending Home Health services. [facility]   Clinical Social Worker will sign off for now as social work intervention is no longer needed. Please consult us again if new need arises.  Derenda FennelBashira Hitoshi Werts, MSW, LCSWA [phone] 03/02/2014 2:19 PM

## 2014-03-02 NOTE — Op Note (Signed)
NAMEJULIOUS, LANGLOIS                ACCOUNT NO.:  [id number]  MEDICAL RECORD NO.:  [id number]  LOCATION:  [id number]                        FACILITY:  [facility]  PHYSICIAN:  Hilda Lias, M.D.   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  POSTOPERATIVE DIAGNOSIS:  Degenerative disk disease at L3-L4, L4-5, L5- S1 with chronic radiculopathy.  PROCEDURE:  Bilateral L3, L4, L5 laminectomy and facetectomy.  Bilateral L3-4 and L4-5 diskectomy more than normal to introduce 2 set the expandable cages.  Pedicle screws at L3-S1 bilaterally.  Posterolateral arthrodesis with autograft and BMP.  Cell Saver.  C-arm  SURGEON:  Hilda Lias, M.D.  ASSISTANT:  Cristi Loron, M.D.  CLINICAL HISTORY:  Mr. Melland is a gentleman who had been seen for many years complaining of back pain worsened to both lower extremities, left worse than right 1.  Although he had been able to work, nevertheless he is remaining in bed most of the time because of the pain.  He has had several MRI which showed worsening and deteriorating of the degenerative disk disease in lumbar spine.  Medication had been of no help.  He has failed with conservative pain clinic therapy.  Because of that, he and his wife agree with surgery.  The surgery was fully explained to him and his wife.  They knew the risk and benefits.  PROCEDURE IN DETAIL:  The patient was taken to the OR and after intubation, he was positioned in a prone manner.  The back was cleaned with Betadine and later on with DuraPrep.  From then on, midline incision from L2-3 down to L5-S1 was made and muscle were retracted laterally.  Immediately, we found that he has continuous bleeding probably because of the chronic intake of fish oil.  With x-ray, we identified the area and we  removed the spinous process of L3, L4, and L5 as well as the lamina of those 3 levels.  We proceeded with the facetectomy removing the facet of L3, L4, and L5.  At the L5-S1, it was almost impossible to get into the disk space.  Including a knife blade did not allow [place] to get into the space well.  From then on, we proceeded with the L4-L5 and L3-L4 diskectomy, first on the right side and then on the left side with a total gross diskectomy more than normal to introduce 2 set of expandable cages with BMP and autograft inside.  This was done with the help of the C-arm.  Then using the C-arm first in AP view and then a lateral view, we made a hole in the pedicles of L3, L4, L5, and S1.  Prior to introducing the screws, we felt the hole just to be sure that we surrounded by bone.  We introduced crews 6.5 from 45 length bilaterally.  The screws were connected with a rod and cap. Then, cross-link from right to left was done.  From then on, we went laterally and we removed the periosteum of L3-4, L4-5, L5-S1 in the  lateral aspect of the facet as well as the close area of the transverse process.  Then, a mix of BMP and autograft was used for arthrodesis. The area was irrigated.  At the end, we had plenty of space for the thecal sac and the nerve root from L3-S1.  Further x-ray showed that indeed there was good alignment and normal position of the screws.  The area was irrigated.  A large Hemovac was left in the epidural space and the wound was closed with several layers of Vicryl and staples.          ______________________________ Hilda LiasErnesto Britiny Defrain, M.D.     EB/MEDQ  D:  03/01/2014  T:  03/02/2014  Job:  [id number]

## 2014-03-02 NOTE — Evaluation (Signed)
Physical Therapy Evaluation Patient Details Name: Tyler Wood MRN: [id number] DOB: [birth year] Today's Date: 03/02/2014   History of Present Illness  Patient is a 49 y/o male s/p L3-4, 4-5, L5-S1 decompression/fusion.   Clinical Impression  Patient presents with increased pain, balance deficits and generalized weakness s/p above surgery. Pt with abnormally elevated HR in 140s during session. Education provided on back precautions. Pt reporting PCA pump not managing pain well. RN notified. Pt would benefit from skilled PT to improve transfers, gait, balance and mobility so pt can maximize independence and return to PLOF.    Follow Up Recommendations Home health PT;Supervision/Assistance - 24 hour    Equipment Recommendations  Rolling walker with 5" wheels    Recommendations for Other Services       Precautions / Restrictions Precautions Precautions: Back;Fall Precaution Booklet Issued: No Precaution Comments: reviewed precautions. Able to recall 2/3 at end of session, 3/3 with assist of wife. Required Braces or Orthoses: Spinal Brace Spinal Brace: Lumbar corset Restrictions Weight Bearing Restrictions: No      Mobility  Bed Mobility Overal bed mobility: Needs Assistance Bed Mobility: Rolling;Sidelying to Sit Rolling: Min guard Sidelying to sit: Min guard       General bed mobility comments: HOB bed 30 degrees, use of rails. Cues for log roll technique.  Transfers Overall transfer level: Needs assistance Equipment used: Rolling walker (2 wheeled) Transfers: Sit to/from Stand Sit to Stand: Min assist;From elevated surface         General transfer comment: Min A to stand from EOB x2 from elevated surface. Cues for anterior translation and hand placement.  Ambulation/Gait Ambulation/Gait assistance: Min guard Ambulation Distance (Feet): 20 Feet (+12') Assistive device: Rolling walker (2 wheeled) Gait Pattern/deviations: Step-through pattern;Trunk flexed;Decreased  stride length   Gait velocity interpretation: Below normal speed for age/gender General Gait Details: Pt with slow, mildly unsteady gait. Dyspnea present. HR elevated to 140s during gait.  Stairs            Wheelchair Mobility    Modified Rankin (Stroke Patients Only)       Balance Overall balance assessment: Needs assistance Sitting-balance support: Feet supported;Single extremity supported Sitting balance-Leahy Scale: Fair Sitting balance - Comments: Able to donn brace with setup.   Standing balance support: During functional activity Standing balance-Leahy Scale: Fair Standing balance comment: ABle to perform dynamic standing during urination without UE support for short periods however requires UE support for gait.                             Pertinent Vitals/Pain Pain Assessment: 0-10 Pain Score: 6  Pain Location: back with mobility Pain Descriptors / Indicators: Sore;Aching Pain Intervention(s): Monitored during session;PCA encouraged;Repositioned    Home Living Family/patient expects to be discharged to:: Private residence Living Arrangements: Spouse/significant other Available Help at Discharge: Family;Available 24 hours/day Type of Home: House Home Access: Stairs to enter Entrance Stairs-Rails: Right Entrance Stairs-Number of Steps: 3 Home Layout: Two level;Able to live on main level with bedroom/bathroom Home Equipment: None      Prior Function Level of Independence: Independent               Hand Dominance        Extremity/Trunk Assessment   Upper Extremity Assessment: Defer to OT evaluation           Lower Extremity Assessment: LLE deficits/detail;Generalized weakness         Communication   Communication:  No difficulties  Cognition Arousal/Alertness: Awake/alert Behavior During Therapy: WFL for tasks assessed/performed Overall Cognitive Status: Within Functional Limits for tasks assessed       Memory:  Decreased recall of precautions              General Comments General comments (skin integrity, edema, etc.): Education provided to change positions ever 30-45 mins.    Exercises        Assessment/Plan    PT Assessment Patient needs continued PT services  PT Diagnosis Difficulty walking;Generalized weakness;Acute pain   PT Problem List Decreased strength;Cardiopulmonary status limiting activity;Pain;Impaired sensation;Decreased activity tolerance;Decreased balance;Decreased mobility;Decreased knowledge of precautions  PT Treatment Interventions Balance training;Gait training;Stair training;Patient/family education;Functional mobility training;Therapeutic activities;Therapeutic exercise   PT Goals (Current goals can be found in the Care Plan section) Acute Rehab PT Goals Patient Stated Goal: to return home PT Goal Formulation: With patient Time For Goal Achievement: 03/16/14 Potential to Achieve Goals: Fair    Frequency Min 5X/week   Barriers to discharge        Co-evaluation               End of Session Equipment Utilized During Treatment: Gait belt;Back brace;Oxygen Activity Tolerance: Patient limited by pain;Patient tolerated treatment well Patient left: in chair;with call bell/phone within reach;with family/visitor present Nurse Communication: Mobility status         Time: 4098-1191 PT Time Calculation (min) (ACUTE ONLY): 35 min   Charges:   PT Evaluation $Initial PT Evaluation Tier I: 1 Procedure PT Treatments $Self Care/Home Management: 8-22   PT G CodesAlvie Heidelberg A 2014-03-16, 11:50 AM  Alvie Heidelberg, PT, DPT [phone]

## 2014-03-02 NOTE — Progress Notes (Signed)
Patient has refused the CPAP machine for tonight. Patient is on the [place] that monitors co2 (while also using the alaris pump) and patient states he cannot get a good fit with the mask while wearing the [place]. Patient says he is comfortable wearing the [place] to sleep with and is in no apparent distress. Patient is aware to call RT at any time if he does change his mind about using the machine. RT will continue to assist as needed.

## 2014-03-02 NOTE — Progress Notes (Signed)
Jenkins sais to change HR limits to 140 on PCA will continue to monitor. Suzy Bouchardhompson, Shelisha Gautier E, [place]RN  03/02/2014 3:55 AM

## 2014-03-03 MED ORDER — OXYCODONE HCL 5 MG PO TABS
20.0000 mg | ORAL_TABLET | ORAL | Status: DC | PRN
  Administered 2014-03-03 – 2014-03-04 (×6): 20 mg via ORAL
  Filled 2014-03-03 (×6): qty 4

## 2014-03-03 MED ORDER — FLEET ENEMA 7-19 GM/118ML RE ENEM: 1.0000 | ENEMA | Freq: Every day | RECTAL | Status: DC | PRN

## 2014-03-03 NOTE — Progress Notes (Signed)
Patient ID: Tyler Wood, male   DOB: [birth year], 49 y.o.   MRN: [id number] C/o incisional pain, no weakness. Abdominal gas, flatus present. See orders

## 2014-03-03 NOTE — Progress Notes (Signed)
Physical Therapy Treatment Patient Details Name: Tyler Wood MRN: [id number] DOB: [birth year] Today's Date: 03/03/2014    History of Present Illness Patient is a 49 y/o male s/p L3-4, 4-5, L5-S1 decompression/fusion.     PT Comments    Patient progressing well with mobility. Improved ambulation distance however continues to exhibit elevated HR with mobility (130s bpm today). Pt with difficulty standing from surfaces secondary to BLE weakness. Will assess ability to negotiate steps next session. Will follow acutely.   Follow Up Recommendations  Home health PT;Supervision/Assistance - 24 hour     Equipment Recommendations  Rolling walker with 5" wheels    Recommendations for Other Services       Precautions / Restrictions Precautions Precautions: Back;Fall Precaution Comments: reviewed precautions. Able to independently verbalize 3/3 precautions. Required Braces or Orthoses: Spinal Brace Spinal Brace: Lumbar corset Restrictions Weight Bearing Restrictions: No    Mobility  Bed Mobility Overal bed mobility: Needs Assistance Bed Mobility: Rolling;Sidelying to Sit Rolling: Supervision Sidelying to sit: Supervision       General bed mobility comments: HOB elevated 30 degrees, use of rail for support. Good demo of log roll technique.  Transfers Overall transfer level: Needs assistance Equipment used: Rolling walker (2 wheeled) Transfers: Sit to/from Stand Sit to Stand: Min guard         General transfer comment: Min guard to stand due to difficulty obtaining upright - verbal cues for hip/knee extension.   Ambulation/Gait Ambulation/Gait assistance: Min guard Ambulation Distance (Feet): 75 Feet Assistive device: Rolling walker (2 wheeled) Gait Pattern/deviations: Step-through pattern;Decreased stride length;Trunk flexed   Gait velocity interpretation: Below normal speed for age/gender General Gait Details: Pt with slow, mildly unsteady gait. Dyspnea present. HR  elevated to 130s during gait.   Stairs            Wheelchair Mobility    Modified Rankin (Stroke Patients Only)       Balance Overall balance assessment: Needs assistance Sitting-balance support: Feet supported;No upper extremity supported Sitting balance-Leahy Scale: Good Sitting balance - Comments: Able to donn brace with setup.   Standing balance support: During functional activity Standing balance-Leahy Scale: Fair Standing balance comment: Able perform standing urination without UE support for short periods.                    Cognition Arousal/Alertness: Awake/alert Behavior During Therapy: WFL for tasks assessed/performed Overall Cognitive Status: Within Functional Limits for tasks assessed                      Exercises      General Comments        Pertinent Vitals/Pain Pain Assessment: No/denies pain Pain Score: 6  Pain Location: back and hips Pain Descriptors / Indicators: Aching;Sore Pain Intervention(s): Limited activity within patient's tolerance;Monitored during session;Repositioned    Home Living                      Prior Function            PT Goals (current goals can now be found in the care plan section) Progress towards PT goals: Progressing toward goals    Frequency  Min 5X/week    PT Plan Current plan remains appropriate    Co-evaluation             End of Session Equipment Utilized During Treatment: Gait belt;Oxygen;Back brace Activity Tolerance: Patient tolerated treatment well;Patient limited by pain Patient left: in chair;with call  bell/phone within reach;with nursing/sitter in room     Time: 1055-1119 PT Time Calculation (min) (ACUTE ONLY): 24 min  Charges:  $Gait Training: 8-22 mins $Therapeutic Activity: 8-22 mins                    G CodesAlvie Heidelberg A 03/04/14, 11:51 AM Alvie Heidelberg, PT, DPT [phone]

## 2014-03-03 NOTE — Progress Notes (Signed)
Occupational Therapy Progress Note:  Pt progressing toward OT goals.  He demonstrates good understanding of back precautions, and is able to verbally instruct therapist in use of AE for LB ADLs (he deferred actual practice today).   HR 143 with DOE 3/4 with activity.   Will see tomorrow to practice tub transfer.   No follow up OT recommended.  Recommend 3in1 for home use.     03/03/14 1300  OT Visit Information  Last OT Received On 03/03/14  Assistance Needed +1  History of Present Illness Patient is a 49 y/o male s/p L3-4, 4-5, L5-S1 decompression/fusion.   OT Time Calculation  OT Start Time (ACUTE ONLY) 1259  OT Stop Time (ACUTE ONLY) 1320  OT Time Calculation (min) 21 min  Precautions  Precautions Back;Fall  Precaution Comments Pt able to state back precautions and demonstrates good awareness of  them during functional activities   Required Braces or Orthoses Spinal Brace  Spinal Brace Lumbar corset  Pain Assessment  Pain Assessment 0-10  Pain Score 6  Pain Location back and hips   Pain Descriptors / Indicators Aching  Pain Intervention(s) Monitored during session;Premedicated before session  Cognition  Arousal/Alertness Awake/alert  Behavior During Therapy WFL for tasks assessed/performed  Overall Cognitive Status Within Functional Limits for tasks assessed  ADL  Toilet Transfer Min guard;Ambulation;BSC;RW  Toileting- DealerClothing Manipulation and Hygiene Min guard;Sit to/from stand  Functional mobility during ADLs Min guard;Rolling walker  General ADL Comments Pt able to verbally  instructe therapist how to use AE for LB ADLs, and where to purchase AE.  He did not wish to practice.  He reports he will likely have his wife assist initially until he is able to cross ankles over his knees due to financial concerns with him not currently working.   Reviewed safe technique for brushing teeth and shaving - avoid bending.  He was able to verbalize understanding   Balance  Overall  balance assessment Needs assistance  Sitting-balance support Feet supported  Sitting balance-Leahy Scale Good  Standing balance support During functional activity  Standing balance-Leahy Scale Fair  Transfers  Overall transfer level Needs assistance  Transfers Sit to/from Stand;Stand Pivot Transfers  Sit to Stand Min guard  Stand pivot transfers Min guard  General transfer comment min guard for safety   OT - End of Session  Equipment Utilized During Treatment Rolling walker;Back brace  Activity Tolerance Patient tolerated treatment well  Patient left in chair;with call bell/phone within reach  Nurse Communication Mobility status (and elevated HR )  OT Assessment/Plan  OT Plan Discharge plan needs to be updated  OT Frequency (ACUTE ONLY) Min 2X/week  Follow Up Recommendations Supervision/Assistance - 24 hour;No OT follow up  OT Equipment 3 in 1 bedside comode  OT Goal Progression  Progress towards OT goals Progressing toward goals  ADL Goals  Pt Will Perform Grooming with modified independence;standing  Pt Will Perform Lower Body Bathing with supervision;with adaptive equipment;sit to/from stand  Pt Will Perform Lower Body Dressing with supervision;with adaptive equipment;sit to/from stand  Pt Will Perform Tub/Shower Transfer with min assist;3 in 1;rolling walker;Tub transfer  Additional ADL Goal #1 Pt will complete all toileting tasks on 3:1 over commode with S.  OT General Charges  $OT Visit 1 Procedure  OT Treatments  $Self Care/Home Management  8-22 mins   Jeani HawkingWendi March Steyer, OTR/L [phone]

## 2014-03-04 MED ORDER — DIAZEPAM 10 MG PO TABS: 10.0000 mg | ORAL_TABLET | Freq: Four times a day (QID) | ORAL | Status: DC | PRN

## 2014-03-04 MED ORDER — OXYCODONE HCL 20 MG PO TABS: 20.0000 mg | ORAL_TABLET | ORAL | Status: DC | PRN

## 2014-03-04 MED ORDER — MANAGING BACK PAIN BOOK
Freq: Once | Status: AC
  Administered 2014-03-04: 02:00:00
  Filled 2014-03-04: qty 1

## 2014-03-04 NOTE — Discharge Summary (Signed)
Physician Discharge Summary  Patient ID: Tyler Wood MRN: [id number] DOB/AGE: 1965/06/08 49 y.o.  Admit date: 03/01/2014 Discharge date: 03/04/2014  Admission Diagnoses:  Discharge Diagnoses:  Active Problems:   Degenerative disc disease, lumbar   Discharged Condition: good  Hospital Course: The patient was admitted to hospital where he underwent an calm K to 3 level lumbar decompression and fusion. Postoperatively he is doing very well. Back and lower extremity pain much improved. Patient up ambulating without difficulty. Ready for discharge home.  Consults:   Significant Diagnostic Studies:   Treatments:   Discharge Exam: Blood pressure 108/58, pulse 131, temperature 99.9 F (37.7 C), temperature source Oral, resp. rate 18, height 6\' 6"  (1.981 m), weight 116.121 kg (256 lb), SpO2 94 %. Awake and alert. Oriented and appropriate. Motor and sensory function intact. Wound clean and dry. Chest and abdomen benign.  Disposition:      Medication List    TAKE these medications        aspirin 81 MG tablet  Take 81 mg by mouth daily.     diazepam 10 MG tablet  Commonly known as:  VALIUM  Take 1 tablet (10 mg total) by mouth every 6 (six) hours as needed for muscle spasms.     FISH OIL PO  Take 1 capsule by mouth daily.     gabapentin 600 MG tablet  Commonly known as:  NEURONTIN  Take 600 mg by mouth 3 (three) times daily.     lisinopril 20 MG tablet  Commonly known as:  PRINIVIL,ZESTRIL  Take 20 mg by mouth daily.     multivitamin with minerals tablet  Take 1 tablet by mouth daily.     Oxycodone HCl 20 MG Tabs  Take 1 tablet (20 mg total) by mouth every 4 (four) hours as needed for severe pain.     oxyCODONE-acetaminophen 10-325 MG per tablet  Commonly known as:  PERCOCET  Take 1 tablet by mouth every 6 (six) hours as needed for pain.           Follow-up Information    Follow up with Karn CassisBOTERO,ERNESTO M, MD. Schedule an appointment as soon as possible for a  visit in 2 weeks.   Specialty:  Neurosurgery   Contact information:   [address] [place] [address] [phone]       Signed: Temple PaciniOOL,Westly Hinnant A 03/04/2014, 9:49 AM

## 2014-03-04 NOTE — Progress Notes (Signed)
Physical Therapy Treatment Patient Details Name: Tyler Wood MRN: [id number] DOB: August 23, 1965 Today's Date: 03/04/2014    History of Present Illness Patient is a 49 y/o male s/p L3-4, 4-5, L5-S1 decompression/fusion.     PT Comments    Patient progressing well with mobility. Strength in BLEs seems improved as pt improved ambulation distance and tolerated negotiating 1 flight of steps with supervision for safety. Eager to return home later today. Able to independently recall 3/3 back precautions. Pt will have necessary assist/supervision at home. Encourage ambulation with wife/RN during day to improve mobility/strenght. Will continue to follow acutely if pt still in hospital tomorrow.   Follow Up Recommendations  Home health PT;Supervision/Assistance - 24 hour     Equipment Recommendations  Rolling walker with 5" wheels    Recommendations for Other Services       Precautions / Restrictions Precautions Precautions: Back;Fall Precaution Booklet Issued: No Precaution Comments: Able to verbalize 3/3 precautions independently. Required Braces or Orthoses: Spinal Brace Spinal Brace: Lumbar corset Restrictions Weight Bearing Restrictions: No    Mobility  Bed Mobility               General bed mobility comments: UP in recliner upon PT arrival.   Transfers Overall transfer level: Needs assistance Equipment used: Rolling walker (2 wheeled) Transfers: Sit to/from Stand Sit to Stand: Min guard         General transfer comment: min guard for safety   Ambulation/Gait Ambulation/Gait assistance: Supervision Ambulation Distance (Feet): 300 Feet Assistive device: Rolling walker (2 wheeled) Gait Pattern/deviations: Step-through pattern;Decreased stride length;Trunk flexed     General Gait Details: Pt with steady gait today. Cues for adhere to back precautions during gait. Dyspnea present.    Stairs Stairs: Yes Stairs assistance: Supervision Stair Management: Two  rails;Alternating pattern;Forwards Number of Stairs: 12 General stair comments: Supervision for safety.   Wheelchair Mobility    Modified Rankin (Stroke Patients Only)       Balance Overall balance assessment: Needs assistance Sitting-balance support: No upper extremity supported;Feet supported Sitting balance-Leahy Scale: Good Sitting balance - Comments: Able to donn brace with setup.   Standing balance support: During functional activity Standing balance-Leahy Scale: Fair Standing balance comment: Able to perform standing with adjustment of brace and donn gown without UE support. Requires UE support for gait.                    Cognition Arousal/Alertness: Awake/alert Behavior During Therapy: WFL for tasks assessed/performed Overall Cognitive Status: Within Functional Limits for tasks assessed                      Exercises      General Comments        Pertinent Vitals/Pain Pain Assessment: 0-10 Pain Score: 4  Pain Location: back at surgical site Pain Descriptors / Indicators: Sore Pain Intervention(s): Monitored during session;Repositioned    Home Living                      Prior Function            PT Goals (current goals can now be found in the care plan section) Progress towards PT goals: Progressing toward goals    Frequency  Min 5X/week    PT Plan Current plan remains appropriate    Co-evaluation             End of Session Equipment Utilized During Treatment: Back brace Activity Tolerance: Patient  tolerated treatment well Patient left: in chair;with call bell/phone within reach     Time: 0920-0934 PT Time Calculation (min) (ACUTE ONLY): 14 min  Charges:  $Gait Training: 8-22 mins                    G CodesAlvie Heidelberg:      Folan, Josuel Koeppen A 03/04/2014, 10:12 AM  Alvie HeidelbergShauna Folan, PT, DPT [phone]

## 2014-03-04 NOTE — Progress Notes (Signed)
Pt was discharged home with home health PT. Discharge instruction were given to patient and family.

## 2014-03-05 NOTE — Care Management Note (Signed)
    Page 1 of 2   03/05/2014     7:28:28 AM CARE MANAGEMENT NOTE 03/05/2014  Patient:  NHIA, HEAPHY   Account Number:  [id number]  Date Initiated:  03/04/2014  Documentation initiated by:  [facility]  Subjective/Objective Assessment:   adm: Degenerative disc disease, lumbar     Action/Plan:   discharge planning   Anticipated DC Date:  03/04/2014   Anticipated DC Plan:  [place]  CM consult      [facility] Choice  HOME HEALTH   Choice offered to / List presented to:  C-1 Patient   DME arranged  Vassie Moselle      DME agency  [place] arranged  [place].   Status of service:  Completed, signed off Medicare Important Message given?   (If response is "NO", the following Medicare IM given date fields will be blank) Date Medicare IM given:   Medicare IM given by:   Date Additional Medicare IM given:   Additional Medicare IM given by:    Discharge Disposition:  [place]  Per UR Regulation:    If discussed at Long Length of Stay Meetings, dates discussed:    Comments:  03/04/14 13:13 CM met with pt to offer choice of home health agency.  Pt chooses [facility] to render HHPT.  Address and contact informaiton verified with pt.  CM called [facility] DME rep, Jeneen Rinks to please deliver rolling walker to room prior to discharge.  Referral called to [facility] rep, [place].  No other CM needs were communicated.  Mariane Masters, BSN, CM [phone].

## 2014-03-08 ENCOUNTER — Encounter (HOSPITAL_COMMUNITY): Payer: Self-pay | Admitting: Neurosurgery

## 2014-11-01 ENCOUNTER — Other Ambulatory Visit: Payer: Self-pay | Admitting: Neurosurgery

## 2014-11-01 DIAGNOSIS — M5417 Radiculopathy, lumbosacral region: Secondary | ICD-10-CM

## 2014-11-02 ENCOUNTER — Ambulatory Visit
Admission: RE | Admit: 2014-11-02 | Discharge: 2014-11-02 | Disposition: A | Discharge status: Home / Self Care | Payer: 59 | Source: Ambulatory Visit | Attending: Neurosurgery | Admitting: Neurosurgery

## 2014-11-02 ENCOUNTER — Other Ambulatory Visit: Payer: Self-pay | Admitting: Neurosurgery

## 2014-11-02 ENCOUNTER — Inpatient Hospital Stay
Admission: RE | Admit: 2014-11-02 | Discharge: 2014-11-02 | Disposition: A | Discharge status: Home / Self Care | Payer: Self-pay | Source: Ambulatory Visit | Attending: Neurosurgery | Admitting: Neurosurgery

## 2014-11-02 VITALS — BP 135/83 | HR 93

## 2014-11-02 DIAGNOSIS — M5417 Radiculopathy, lumbosacral region: Secondary | ICD-10-CM

## 2014-11-02 DIAGNOSIS — M5136 Other intervertebral disc degeneration, lumbar region: Secondary | ICD-10-CM

## 2014-11-02 MED ORDER — IOHEXOL 180 MG/ML  SOLN
15.0000 mL | Freq: Once | INTRAMUSCULAR | Status: DC | PRN
  Administered 2014-11-02: 15 mL via INTRATHECAL

## 2014-11-02 MED ORDER — DIAZEPAM 5 MG PO TABS
10.0000 mg | ORAL_TABLET | Freq: Once | ORAL | Status: AC
  Administered 2014-11-02: 10 mg via ORAL

## 2014-11-02 NOTE — Discharge Instructions (Signed)

## 2014-12-29 ENCOUNTER — Other Ambulatory Visit: Payer: Self-pay | Admitting: Neurosurgery

## 2015-01-03 NOTE — Pre-Procedure Instructions (Signed)
Tyler Wood  01/03/2015     Your procedure is scheduled on : Tuesday January 09, 2015 at 2:30 PM.  [facility] AM.  Call this number if you have problems the morning of surgery: [phone]    Remember:  Do not eat food or drink liquids after midnight.  Take these medicines the morning of surgery with A SIP OF WATER : Gabapentin (Neurontin), Omeprazole (Prilosec), Oxycodone if needed   Stop taking any aspirin, NSAIDs, vitamins, Ibuprofen, Advil, Motrin, Aleve, Fish oil, etc   Do not wear jewelry.  Do not wear lotions, powders, or cologne.    Men may shave face and neck.  Do not bring valuables to the hospital.  [facility] is not responsible for any belongings or valuables.  Contacts, dentures or bridgework may not be worn into surgery.  Leave your suitcase in the car.  After surgery it may be brought to your room.  For patients admitted to the hospital, discharge time will be determined by your treatment team.  Patients discharged the day of surgery will not be allowed to drive home.   Name and phone number of your driver:   Stark BrayLorraine Standiford  Spouse           Special instructions:  Shower using CHG soap the night before and the morning of your surgery  Please read over the following fact sheets that you were given. Pain Booklet, Coughing and Deep Breathing, MRSA Information and Surgical Site Infection Prevention

## 2015-01-04 ENCOUNTER — Encounter (HOSPITAL_COMMUNITY): Payer: Self-pay | Admitting: Anesthesiology

## 2015-01-04 ENCOUNTER — Encounter (HOSPITAL_COMMUNITY): Payer: Self-pay

## 2015-01-04 ENCOUNTER — Encounter (HOSPITAL_COMMUNITY): Payer: Self-pay | Admitting: Vascular Surgery

## 2015-01-04 DIAGNOSIS — Z7982 Long term (current) use of aspirin: Secondary | ICD-10-CM | POA: Diagnosis not present

## 2015-01-04 DIAGNOSIS — Z01818 Encounter for other preprocedural examination: Secondary | ICD-10-CM | POA: Diagnosis not present

## 2015-01-04 DIAGNOSIS — K219 Gastro-esophageal reflux disease without esophagitis: Secondary | ICD-10-CM | POA: Diagnosis not present

## 2015-01-04 DIAGNOSIS — Z981 Arthrodesis status: Secondary | ICD-10-CM | POA: Diagnosis not present

## 2015-01-04 DIAGNOSIS — M5417 Radiculopathy, lumbosacral region: Secondary | ICD-10-CM | POA: Diagnosis not present

## 2015-01-04 DIAGNOSIS — G4733 Obstructive sleep apnea (adult) (pediatric): Secondary | ICD-10-CM | POA: Diagnosis not present

## 2015-01-04 DIAGNOSIS — Z01812 Encounter for preprocedural laboratory examination: Secondary | ICD-10-CM | POA: Diagnosis not present

## 2015-01-04 DIAGNOSIS — Z79899 Other long term (current) drug therapy: Secondary | ICD-10-CM | POA: Diagnosis not present

## 2015-01-04 DIAGNOSIS — I10 Essential (primary) hypertension: Secondary | ICD-10-CM | POA: Diagnosis not present

## 2015-01-04 LAB — CBC
HEMATOCRIT: 43.3 % (ref 39.0–52.0)
Hemoglobin: 15 g/dL (ref 13.0–17.0)
MCH: 31.8 pg (ref 26.0–34.0)
MCHC: 34.6 g/dL (ref 30.0–36.0)
MCV: 91.9 fL (ref 78.0–100.0)
PLATELETS: 200 10*3/uL (ref 150–400)
RBC: 4.71 MIL/uL (ref 4.22–5.81)
RDW: 13.2 % (ref 11.5–15.5)
WBC: 10.6 10*3/uL — AB (ref 4.0–10.5)

## 2015-01-04 LAB — SURGICAL PCR SCREEN
MRSA, PCR: NEGATIVE
Staphylococcus aureus: NEGATIVE

## 2015-01-04 LAB — BASIC METABOLIC PANEL
ANION GAP: 12 (ref 5–15)
BUN: 15 mg/dL (ref 6–20)
CO2: 25 mmol/L (ref 22–32)
Calcium: 9.6 mg/dL (ref 8.9–10.3)
Chloride: 98 mmol/L — ABNORMAL LOW (ref 101–111)
Creatinine, Ser: 0.81 mg/dL (ref 0.61–1.24)
GFR calc Af Amer: >60 mL/min (ref >60)
GLUCOSE: 232 mg/dL — AB (ref 65–99)
POTASSIUM: 4.1 mmol/L (ref 3.5–5.1)
Sodium: 135 mmol/L (ref 135–145)

## 2015-01-04 NOTE — Progress Notes (Addendum)
Denies any heart problems, has not seen cardiologist. Last EKG 02/2014 in epic Will bring CPAP mask Post PAT labs -- glucose 232.  Patient denies diabetes.  Had coffee with creamer this am.

## 2015-01-04 NOTE — Progress Notes (Addendum)
Anesthesia Chart Review: Patient is a 49 year old male scheduled for right L5-S1 foraminotomy on 01/09/15 by Dr. Jeral FruitBotero.  History includes smoking, GERD, HTN, OSA with CPAP use, arthritis, L3-S1 fusion 03/01/14. BMI is 30. PCP is Dr. Paulino DoorMark Weiser.   Meds include ASA 81 mg (on hold), Flexeril, lisinopril, fish oil, Prilosec, oxycodone.  PAT Vitals: BP 137/94, RR 20, HR 99, T 37.2C, O2 sat 100%.  03/02/14 EKG: ST at 123 bpm. (HR today 99 bpm).  Preoperative labs noted. Non-fasting glucose 232. No history of DM. Had coffee with creamer yesterday. Jessica from Dr. Cassandria SanteeBotero's office contacted patient to have glucose repeated at his PCP or at PAT. Patient to come in to PAT for fasting CBG tomorrow morning. Additional input following results. A1C also added to today's labs, but results are pending.  Tyler Ochsllison Judas Mohammad, PA-C [facility]Anesthesiology Phone [phone] 01/04/2015 5:45 PM  Addendum: Patient came in this morning for fasting CBG with result of 164. His A1C is elevated at 9.8, consistent with a mean plasma glucose of 235. We discussed that his A1C was consistent with diabetes, but would need to follow-up with his PCP to get a definitive diagnosis, as he has been getting epidural steroid injections, last three weeks ago, that may be skewing the results. He denied polydipsia, polyuria, and visual changes. Denied ever being told he had an elevated or borderline blood glucose. He eats a lot of bread and drinks sugary drinks (sweet tea, energy drinks). We discussed some basic foods/food groups that could effect his glucose. He was advised that Dr. Cassandria SanteeBotero's office would be notified of elevated glucose and A1C. Since his fasting glucose level is in an acceptable range for anesthesia, could likely proceed from our standpoint as long as his CBG on arrival was < 200. However, we discussed that Dr. Jeral FruitBotero may choose to delay due to wound/infection risks with untreated hyperglycemia. If Dr. Jeral FruitBotero  plans to proceed, we discussed need for PCP follow-up very soon after his surgery to reassess glucose. Reportedly, patient is scheduled to go home the same day of his surgery. Jessica at Dr. Cassandria SanteeBotero's office updated. She will have him review labs. Also, they are still awaiting insurance approval for his surgery. I have spoken with Chrissy at Dr. Laqueta JeanWeiser's office, and will fax my note and labs results for him or his associate to review (fax confirmation received). Anesthesiologist Dr. Aleene DavidsonE. Fitzgerald also updated.    Tyler Ochsllison Fadia Marlar, PA-C [facility] Phone [phone] 01/05/2015 9:40 AM

## 2015-01-05 ENCOUNTER — Encounter (HOSPITAL_COMMUNITY)
Admission: RE | Admit: 2015-01-05 | Discharge: 2015-01-05 | Disposition: A | Discharge status: Home / Self Care | Payer: 59 | Source: Ambulatory Visit | Attending: Neurosurgery | Admitting: Neurosurgery

## 2015-01-05 DIAGNOSIS — I10 Essential (primary) hypertension: Secondary | ICD-10-CM | POA: Insufficient documentation

## 2015-01-05 DIAGNOSIS — Z79899 Other long term (current) drug therapy: Secondary | ICD-10-CM | POA: Insufficient documentation

## 2015-01-05 DIAGNOSIS — Z01812 Encounter for preprocedural laboratory examination: Secondary | ICD-10-CM | POA: Insufficient documentation

## 2015-01-05 DIAGNOSIS — Z981 Arthrodesis status: Secondary | ICD-10-CM | POA: Insufficient documentation

## 2015-01-05 DIAGNOSIS — Z01818 Encounter for other preprocedural examination: Secondary | ICD-10-CM | POA: Insufficient documentation

## 2015-01-05 DIAGNOSIS — Z7982 Long term (current) use of aspirin: Secondary | ICD-10-CM | POA: Insufficient documentation

## 2015-01-05 DIAGNOSIS — M5417 Radiculopathy, lumbosacral region: Secondary | ICD-10-CM | POA: Insufficient documentation

## 2015-01-05 DIAGNOSIS — K219 Gastro-esophageal reflux disease without esophagitis: Secondary | ICD-10-CM | POA: Insufficient documentation

## 2015-01-05 DIAGNOSIS — G4733 Obstructive sleep apnea (adult) (pediatric): Secondary | ICD-10-CM | POA: Insufficient documentation

## 2015-01-05 HISTORY — DX: Gastro-esophageal reflux disease without esophagitis: K21.9

## 2015-01-05 LAB — GLUCOSE, CAPILLARY: GLUCOSE-CAPILLARY: 164 mg/dL — AB (ref 65–99)

## 2015-01-05 LAB — HEMOGLOBIN A1C
HEMOGLOBIN A1C: 9.8 % — AB (ref 4.8–5.6)
MEAN PLASMA GLUCOSE: 235 mg/dL

## 2015-01-09 ENCOUNTER — Other Ambulatory Visit: Payer: Self-pay | Admitting: Neurosurgery

## 2015-01-09 ENCOUNTER — Encounter (HOSPITAL_COMMUNITY): Admission: RE | Payer: Self-pay | Source: Ambulatory Visit

## 2015-01-09 ENCOUNTER — Ambulatory Visit (HOSPITAL_COMMUNITY): Admission: RE | Admit: 2015-01-09 | Payer: 59 | Source: Ambulatory Visit | Admitting: Neurosurgery

## 2015-01-09 SURGERY — LUMBAR LAMINECTOMY/DECOMPRESSION MICRODISCECTOMY 1 LEVEL
Anesthesia: General | Laterality: Right

## 2015-01-11 MED ORDER — CEFAZOLIN SODIUM-DEXTROSE 2-3 GM-% IV SOLR
2.0000 g | INTRAVENOUS | Status: AC
  Administered 2015-01-12: 2 g via INTRAVENOUS
  Filled 2015-01-11: qty 50

## 2015-01-12 ENCOUNTER — Encounter (HOSPITAL_COMMUNITY)
Admission: RE | Disposition: A | Discharge status: Home / Self Care | Payer: Self-pay | Source: Ambulatory Visit | Attending: Neurosurgery

## 2015-01-12 ENCOUNTER — Inpatient Hospital Stay (HOSPITAL_COMMUNITY)
Admission: RE | Admit: 2015-01-12 | Discharge: 2015-01-15 | DRG: 517 | Disposition: A | Discharge status: Home / Self Care | Payer: 59 | Source: Ambulatory Visit | Attending: Neurosurgery | Admitting: Neurosurgery

## 2015-01-12 ENCOUNTER — Ambulatory Visit (HOSPITAL_COMMUNITY): Payer: 59

## 2015-01-12 ENCOUNTER — Ambulatory Visit (HOSPITAL_COMMUNITY): Payer: 59 | Admitting: Anesthesiology

## 2015-01-12 ENCOUNTER — Encounter (HOSPITAL_COMMUNITY): Payer: Self-pay | Admitting: Anesthesiology

## 2015-01-12 DIAGNOSIS — F17200 Nicotine dependence, unspecified, uncomplicated: Secondary | ICD-10-CM | POA: Diagnosis present

## 2015-01-12 DIAGNOSIS — M4806 Spinal stenosis, lumbar region: Principal | ICD-10-CM | POA: Diagnosis present

## 2015-01-12 DIAGNOSIS — K219 Gastro-esophageal reflux disease without esophagitis: Secondary | ICD-10-CM | POA: Diagnosis present

## 2015-01-12 DIAGNOSIS — G4730 Sleep apnea, unspecified: Secondary | ICD-10-CM | POA: Diagnosis present

## 2015-01-12 DIAGNOSIS — Z79899 Other long term (current) drug therapy: Secondary | ICD-10-CM | POA: Diagnosis not present

## 2015-01-12 DIAGNOSIS — Z981 Arthrodesis status: Secondary | ICD-10-CM

## 2015-01-12 DIAGNOSIS — M79604 Pain in right leg: Secondary | ICD-10-CM | POA: Diagnosis present

## 2015-01-12 DIAGNOSIS — Z7982 Long term (current) use of aspirin: Secondary | ICD-10-CM

## 2015-01-12 DIAGNOSIS — M48061 Spinal stenosis, lumbar region without neurogenic claudication: Secondary | ICD-10-CM | POA: Diagnosis present

## 2015-01-12 DIAGNOSIS — M5416 Radiculopathy, lumbar region: Secondary | ICD-10-CM | POA: Diagnosis present

## 2015-01-12 DIAGNOSIS — Z419 Encounter for procedure for purposes other than remedying health state, unspecified: Secondary | ICD-10-CM

## 2015-01-12 DIAGNOSIS — I10 Essential (primary) hypertension: Secondary | ICD-10-CM | POA: Diagnosis present

## 2015-01-12 HISTORY — PX: LUMBAR LAMINECTOMY/DECOMPRESSION MICRODISCECTOMY: SHX5026

## 2015-01-12 HISTORY — PX: FORAMINOTOMY 1 LEVEL: SHX5835

## 2015-01-12 LAB — CBC
HCT: 37.9 % — ABNORMAL LOW (ref 39.0–52.0)
Hemoglobin: 12.8 g/dL — ABNORMAL LOW (ref 13.0–17.0)
MCH: 31.4 pg (ref 26.0–34.0)
MCHC: 33.8 g/dL (ref 30.0–36.0)
MCV: 93.1 fL (ref 78.0–100.0)
PLATELETS: 174 10*3/uL (ref 150–400)
RBC: 4.07 MIL/uL — ABNORMAL LOW (ref 4.22–5.81)
RDW: 13.5 % (ref 11.5–15.5)
WBC: 14.5 10*3/uL — ABNORMAL HIGH (ref 4.0–10.5)

## 2015-01-12 LAB — TYPE AND SCREEN
ABO/RH(D): A NEG
Antibody Screen: NEGATIVE

## 2015-01-12 LAB — CREATININE, SERUM
Creatinine, Ser: 1.13 mg/dL (ref 0.61–1.24)
GFR calc Af Amer: >60 mL/min (ref >60)
GFR calc non Af Amer: >60 mL/min (ref >60)

## 2015-01-12 LAB — GLUCOSE, CAPILLARY
Glucose-Capillary: 184 mg/dL — ABNORMAL HIGH (ref 65–99)
Glucose-Capillary: 155 mg/dL — ABNORMAL HIGH (ref 65–99)
Glucose-Capillary: 189 mg/dL — ABNORMAL HIGH (ref 65–99)

## 2015-01-12 SURGERY — LUMBAR LAMINECTOMY/DECOMPRESSION MICRODISCECTOMY 1 LEVEL
Anesthesia: General | Laterality: Right

## 2015-01-12 MED ORDER — 0.9 % SODIUM CHLORIDE (POUR BTL) OPTIME
TOPICAL | Status: DC | PRN
  Administered 2015-01-12: 1000 mL

## 2015-01-12 MED ORDER — PHENOL 1.4 % MT LIQD: 1.0000 | OROMUCOSAL | Status: DC | PRN

## 2015-01-12 MED ORDER — LISINOPRIL 20 MG PO TABS
20.0000 mg | ORAL_TABLET | Freq: Every day | ORAL | Status: DC
  Administered 2015-01-13 – 2015-01-15 (×3): 20 mg via ORAL
  Filled 2015-01-12 (×3): qty 1

## 2015-01-12 MED ORDER — DIPHENHYDRAMINE HCL 50 MG/ML IJ SOLN: 12.5000 mg | Freq: Four times a day (QID) | INTRAMUSCULAR | Status: DC | PRN

## 2015-01-12 MED ORDER — MIDAZOLAM HCL 2 MG/2ML IJ SOLN
INTRAMUSCULAR | Status: AC
  Filled 2015-01-12: qty 2

## 2015-01-12 MED ORDER — ROCURONIUM BROMIDE 100 MG/10ML IV SOLN
INTRAVENOUS | Status: DC | PRN
  Administered 2015-01-12: 50 mg via INTRAVENOUS

## 2015-01-12 MED ORDER — FENTANYL CITRATE (PF) 250 MCG/5ML IJ SOLN
INTRAMUSCULAR | Status: AC
  Filled 2015-01-12: qty 5

## 2015-01-12 MED ORDER — PROPOFOL 10 MG/ML IV BOLUS
INTRAVENOUS | Status: DC | PRN
  Administered 2015-01-12: 300 mg via INTRAVENOUS

## 2015-01-12 MED ORDER — MENTHOL 3 MG MT LOZG: 1.0000 | LOZENGE | OROMUCOSAL | Status: DC | PRN

## 2015-01-12 MED ORDER — SODIUM CHLORIDE 0.9 % IJ SOLN: 9.0000 mL | INTRAMUSCULAR | Status: DC | PRN

## 2015-01-12 MED ORDER — CEFAZOLIN SODIUM 1-5 GM-% IV SOLN
1.0000 g | Freq: Three times a day (TID) | INTRAVENOUS | Status: AC
  Administered 2015-01-12 – 2015-01-13 (×2): 1 g via INTRAVENOUS
  Filled 2015-01-12 (×2): qty 50

## 2015-01-12 MED ORDER — ACETAMINOPHEN 325 MG PO TABS: 650.0000 mg | ORAL_TABLET | ORAL | Status: DC | PRN

## 2015-01-12 MED ORDER — HYDROMORPHONE 1 MG/ML IV SOLN
INTRAVENOUS | Status: DC
  Administered 2015-01-12: 20:00:00 via INTRAVENOUS
  Filled 2015-01-12: qty 25

## 2015-01-12 MED ORDER — HEPARIN SODIUM (PORCINE) 5000 UNIT/ML IJ SOLN
5000.0000 [IU] | Freq: Three times a day (TID) | INTRAMUSCULAR | Status: DC
  Filled 2015-01-12: qty 1

## 2015-01-12 MED ORDER — ONDANSETRON HCL 4 MG/2ML IJ SOLN
INTRAMUSCULAR | Status: AC
  Filled 2015-01-12: qty 2

## 2015-01-12 MED ORDER — DIPHENHYDRAMINE HCL 12.5 MG/5ML PO ELIX: 12.5000 mg | ORAL_SOLUTION | Freq: Four times a day (QID) | ORAL | Status: DC | PRN

## 2015-01-12 MED ORDER — ACETAMINOPHEN 650 MG RE SUPP: 650.0000 mg | RECTAL | Status: DC | PRN

## 2015-01-12 MED ORDER — LABETALOL HCL 5 MG/ML IV SOLN
INTRAVENOUS | Status: DC | PRN
  Administered 2015-01-12 (×2): 10 mg via INTRAVENOUS

## 2015-01-12 MED ORDER — DIAZEPAM 5 MG PO TABS
ORAL_TABLET | ORAL | Status: AC
  Filled 2015-01-12: qty 1

## 2015-01-12 MED ORDER — DIAZEPAM 5 MG/ML IJ SOLN
5.0000 mg | INTRAMUSCULAR | Status: DC | PRN
Start: 2015-01-12 — End: 2015-01-15
  Administered 2015-01-12: 5 mg via INTRAVENOUS
  Administered 2015-01-12: 10 mg via INTRAVENOUS
  Administered 2015-01-13 – 2015-01-15 (×8): 5 mg via INTRAVENOUS
  Filled 2015-01-12 (×10): qty 2

## 2015-01-12 MED ORDER — LIDOCAINE HCL (CARDIAC) 20 MG/ML IV SOLN
INTRAVENOUS | Status: AC
  Filled 2015-01-12: qty 5

## 2015-01-12 MED ORDER — SODIUM CHLORIDE 0.9 % IJ SOLN: 3.0000 mL | INTRAMUSCULAR | Status: DC | PRN

## 2015-01-12 MED ORDER — SODIUM CHLORIDE 0.9 % IJ SOLN
3.0000 mL | Freq: Two times a day (BID) | INTRAMUSCULAR | Status: DC
  Administered 2015-01-12 – 2015-01-14 (×3): 3 mL via INTRAVENOUS

## 2015-01-12 MED ORDER — PROPOFOL 10 MG/ML IV BOLUS
INTRAVENOUS | Status: AC
  Filled 2015-01-12: qty 20

## 2015-01-12 MED ORDER — GABAPENTIN 600 MG PO TABS
600.0000 mg | ORAL_TABLET | Freq: Three times a day (TID) | ORAL | Status: DC
  Administered 2015-01-12 – 2015-01-15 (×8): 600 mg via ORAL
  Filled 2015-01-12 (×8): qty 1

## 2015-01-12 MED ORDER — ESMOLOL HCL 100 MG/10ML IV SOLN
INTRAVENOUS | Status: DC | PRN
  Administered 2015-01-12: 20 mg via INTRAVENOUS
  Administered 2015-01-12: 40 mg via INTRAVENOUS

## 2015-01-12 MED ORDER — OXYCODONE-ACETAMINOPHEN 5-325 MG PO TABS
ORAL_TABLET | ORAL | Status: AC
  Filled 2015-01-12: qty 1

## 2015-01-12 MED ORDER — ROCURONIUM BROMIDE 50 MG/5ML IV SOLN
INTRAVENOUS | Status: AC
  Filled 2015-01-12: qty 1

## 2015-01-12 MED ORDER — LACTATED RINGERS IV SOLN
INTRAVENOUS | Status: DC
  Administered 2015-01-12 (×4): via INTRAVENOUS

## 2015-01-12 MED ORDER — ONDANSETRON HCL 4 MG/2ML IJ SOLN: 4.0000 mg | Freq: Four times a day (QID) | INTRAMUSCULAR | Status: DC | PRN

## 2015-01-12 MED ORDER — ONDANSETRON HCL 4 MG/2ML IJ SOLN
INTRAMUSCULAR | Status: DC | PRN
  Administered 2015-01-12: 4 mg via INTRAVENOUS

## 2015-01-12 MED ORDER — PROMETHAZINE HCL 25 MG/ML IJ SOLN: 6.2500 mg | INTRAMUSCULAR | Status: DC | PRN

## 2015-01-12 MED ORDER — NALOXONE HCL 0.4 MG/ML IJ SOLN: 0.4000 mg | INTRAMUSCULAR | Status: DC | PRN

## 2015-01-12 MED ORDER — HEPARIN SODIUM (PORCINE) 5000 UNIT/ML IJ SOLN
5000.0000 [IU] | Freq: Three times a day (TID) | INTRAMUSCULAR | Status: DC
  Administered 2015-01-12 – 2015-01-15 (×8): 5000 [IU] via SUBCUTANEOUS
  Filled 2015-01-12 (×7): qty 1

## 2015-01-12 MED ORDER — ASPIRIN 81 MG PO CHEW
81.0000 mg | CHEWABLE_TABLET | Freq: Every day | ORAL | Status: DC
  Administered 2015-01-13 – 2015-01-15 (×3): 81 mg via ORAL
  Filled 2015-01-12 (×3): qty 1

## 2015-01-12 MED ORDER — HEMOSTATIC AGENTS (NO CHARGE) OPTIME
TOPICAL | Status: DC | PRN
  Administered 2015-01-12 (×2): 1 via TOPICAL

## 2015-01-12 MED ORDER — SODIUM CHLORIDE 0.9 % IV SOLN: 250.0000 mL | INTRAVENOUS | Status: DC

## 2015-01-12 MED ORDER — MIDAZOLAM HCL 5 MG/5ML IJ SOLN
INTRAMUSCULAR | Status: DC | PRN
  Administered 2015-01-12: 2 mg via INTRAVENOUS

## 2015-01-12 MED ORDER — ONDANSETRON HCL 4 MG/2ML IJ SOLN: 4.0000 mg | INTRAMUSCULAR | Status: DC | PRN

## 2015-01-12 MED ORDER — OXYCODONE-ACETAMINOPHEN 5-325 MG PO TABS
1.0000 | ORAL_TABLET | ORAL | Status: DC | PRN
  Administered 2015-01-12: 1 via ORAL
  Administered 2015-01-12 – 2015-01-15 (×11): 2 via ORAL
  Filled 2015-01-12 (×11): qty 2

## 2015-01-12 MED ORDER — DIAZEPAM 5 MG PO TABS
10.0000 mg | ORAL_TABLET | Freq: Four times a day (QID) | ORAL | Status: DC | PRN
  Administered 2015-01-12: 10 mg via ORAL

## 2015-01-12 MED ORDER — MORPHINE SULFATE 2 MG/ML IV SOLN
INTRAVENOUS | Status: AC
  Administered 2015-01-12: 2 mg
  Filled 2015-01-12: qty 25

## 2015-01-12 MED ORDER — GLYCOPYRROLATE 0.2 MG/ML IJ SOLN
INTRAMUSCULAR | Status: DC | PRN
  Administered 2015-01-12: .8 mg via INTRAVENOUS

## 2015-01-12 MED ORDER — FENTANYL CITRATE (PF) 100 MCG/2ML IJ SOLN
INTRAMUSCULAR | Status: DC | PRN
  Administered 2015-01-12 (×8): 50 ug via INTRAVENOUS
  Administered 2015-01-12: 200 ug via INTRAVENOUS
  Administered 2015-01-12 (×3): 50 ug via INTRAVENOUS

## 2015-01-12 MED ORDER — MORPHINE SULFATE 2 MG/ML IV SOLN: INTRAVENOUS | Status: DC

## 2015-01-12 MED ORDER — NEOSTIGMINE METHYLSULFATE 10 MG/10ML IV SOLN
INTRAVENOUS | Status: DC | PRN
  Administered 2015-01-12: 5 mg via INTRAVENOUS

## 2015-01-12 MED ORDER — VECURONIUM BROMIDE 10 MG IV SOLR
INTRAVENOUS | Status: DC | PRN
  Administered 2015-01-12 (×4): 1 mg via INTRAVENOUS
  Administered 2015-01-12 (×3): 2 mg via INTRAVENOUS
  Administered 2015-01-12: 3 mg via INTRAVENOUS
  Administered 2015-01-12: 1 mg via INTRAVENOUS

## 2015-01-12 MED ORDER — FENTANYL 40 MCG/ML IV SOLN
INTRAVENOUS | Status: DC
  Administered 2015-01-13: 40 ug via INTRAVENOUS
  Administered 2015-01-13: 270 ug via INTRAVENOUS
  Filled 2015-01-12: qty 25

## 2015-01-12 MED ORDER — HYDROMORPHONE HCL 1 MG/ML IJ SOLN: 0.2500 mg | INTRAMUSCULAR | Status: DC | PRN

## 2015-01-12 MED ORDER — LIDOCAINE HCL (CARDIAC) 20 MG/ML IV SOLN
INTRAVENOUS | Status: DC | PRN
  Administered 2015-01-12: 100 mg via INTRAVENOUS

## 2015-01-12 MED ORDER — VANCOMYCIN HCL 1000 MG IV SOLR
INTRAVENOUS | Status: AC
  Filled 2015-01-12: qty 1000

## 2015-01-12 MED ORDER — THROMBIN 5000 UNITS EX SOLR
OROMUCOSAL | Status: DC | PRN
  Administered 2015-01-12 (×2): 10 mL via TOPICAL
  Administered 2015-01-12: 13:00:00 via TOPICAL

## 2015-01-12 MED ORDER — SODIUM CHLORIDE 0.9 % IV SOLN
INTRAVENOUS | Status: DC | PRN
  Administered 2015-01-12: 13:00:00 via INTRAVENOUS

## 2015-01-12 MED ORDER — SODIUM CHLORIDE 0.9 % IV SOLN
INTRAVENOUS | Status: DC
  Administered 2015-01-13: 15:00:00 via INTRAVENOUS

## 2015-01-12 MED ORDER — MEPERIDINE HCL 25 MG/ML IJ SOLN
6.2500 mg | INTRAMUSCULAR | Status: DC | PRN
Start: 2015-01-12 — End: 2015-01-12

## 2015-01-12 MED ORDER — THROMBIN 5000 UNITS EX SOLR
CUTANEOUS | Status: DC | PRN
  Administered 2015-01-12 (×4): 5000 [IU] via TOPICAL

## 2015-01-12 SURGICAL SUPPLY — 65 items
ADH SKN CLS APL DERMABOND .7 (GAUZE/BANDAGES/DRESSINGS) ×1
APL SKNCLS STERI-STRIP NONHPOA (GAUZE/BANDAGES/DRESSINGS) ×1
BENZOIN TINCTURE PRP APPL 2/3 (GAUZE/BANDAGES/DRESSINGS) ×2 IMPLANT
BLADE CLIPPER SURG (BLADE) IMPLANT
BUR ACORN 6.0 (BURR) ×4 IMPLANT
BUR MATCHSTICK NEURO 3.0 LAGG (BURR) ×4 IMPLANT
CANISTER SUCT 3000ML PPV (MISCELLANEOUS) ×2 IMPLANT
DERMABOND ADVANCED (GAUZE/BANDAGES/DRESSINGS) ×1
DERMABOND ADVANCED .7 DNX12 (GAUZE/BANDAGES/DRESSINGS) ×1 IMPLANT
DRAPE LAPAROTOMY 100X72X124 (DRAPES) ×2 IMPLANT
DRAPE MICROSCOPE LEICA (MISCELLANEOUS) ×2 IMPLANT
DRAPE POUCH INSTRU U-SHP 10X18 (DRAPES) ×2 IMPLANT
DRSG PAD ABDOMINAL 8X10 ST (GAUZE/BANDAGES/DRESSINGS) IMPLANT
DURAPREP 26ML APPLICATOR (WOUND CARE) ×2 IMPLANT
ELECT BLADE 4.0 EZ CLEAN MEGAD (MISCELLANEOUS) ×2
ELECT REM PT RETURN 9FT ADLT (ELECTROSURGICAL) ×2
ELECTRODE BLDE 4.0 EZ CLN MEGD (MISCELLANEOUS) ×1 IMPLANT
ELECTRODE REM PT RTRN 9FT ADLT (ELECTROSURGICAL) ×1 IMPLANT
GAUZE SPONGE 4X4 12PLY STRL (GAUZE/BANDAGES/DRESSINGS) ×2 IMPLANT
GAUZE SPONGE 4X4 16PLY XRAY LF (GAUZE/BANDAGES/DRESSINGS) IMPLANT
GLOVE BIO SURGEON STRL SZ 6 (GLOVE) ×4 IMPLANT
GLOVE BIO SURGEON STRL SZ8 (GLOVE) ×4 IMPLANT
GLOVE BIO SURGEON STRL SZ8.5 (GLOVE) ×4 IMPLANT
GLOVE BIOGEL M 8.0 STRL (GLOVE) ×2 IMPLANT
GLOVE ECLIPSE 7.5 STRL STRAW (GLOVE) ×2 IMPLANT
GLOVE EXAM NITRILE LRG STRL (GLOVE) IMPLANT
GLOVE EXAM NITRILE MD LF STRL (GLOVE) IMPLANT
GLOVE EXAM NITRILE XL STR (GLOVE) IMPLANT
GLOVE EXAM NITRILE XS STR PU (GLOVE) IMPLANT
GLOVE INDICATOR 6.5 STRL GRN (GLOVE) ×2 IMPLANT
GLOVE INDICATOR 7.5 STRL GRN (GLOVE) ×2 IMPLANT
GLOVE INDICATOR 8.0 STRL GRN (GLOVE) ×2 IMPLANT
GOWN STRL REUS W/ TWL LRG LVL3 (GOWN DISPOSABLE) ×4 IMPLANT
GOWN STRL REUS W/ TWL XL LVL3 (GOWN DISPOSABLE) ×2 IMPLANT
GOWN STRL REUS W/TWL 2XL LVL3 (GOWN DISPOSABLE) ×2 IMPLANT
GOWN STRL REUS W/TWL LRG LVL3 (GOWN DISPOSABLE) ×8
GOWN STRL REUS W/TWL XL LVL3 (GOWN DISPOSABLE) ×4
GRAFT DURAGEN MATRIX 1WX1L (Tissue) ×2 IMPLANT
GRAFT DURAGEN MATRIX 2WX2L ×2 IMPLANT
KIT BASIN OR (CUSTOM PROCEDURE TRAY) ×2 IMPLANT
KIT ROOM TURNOVER OR (KITS) ×2 IMPLANT
NEEDLE HYPO 18GX1.5 BLUNT FILL (NEEDLE) IMPLANT
NEEDLE HYPO 21X1.5 SAFETY (NEEDLE) IMPLANT
NEEDLE HYPO 25X1 1.5 SAFETY (NEEDLE) IMPLANT
NEEDLE SPNL 20GX3.5 QUINCKE YW (NEEDLE) IMPLANT
NS IRRIG 1000ML POUR BTL (IV SOLUTION) ×2 IMPLANT
PACK LAMINECTOMY NEURO (CUSTOM PROCEDURE TRAY) ×2 IMPLANT
PAD ARMBOARD 7.5X6 YLW CONV (MISCELLANEOUS) ×6 IMPLANT
PATTIES SURGICAL .5 X.5 (GAUZE/BANDAGES/DRESSINGS) ×2 IMPLANT
PATTIES SURGICAL .5 X1 (DISPOSABLE) ×4 IMPLANT
PATTIES SURGICAL 1X1 (DISPOSABLE) ×2 IMPLANT
RUBBERBAND STERILE (MISCELLANEOUS) ×4 IMPLANT
SPONGE LAP 4X18 X RAY DECT (DISPOSABLE) IMPLANT
SPONGE SURGIFOAM ABS GEL SZ50 (HEMOSTASIS) ×2 IMPLANT
STAPLER VISISTAT 35W (STAPLE) ×2 IMPLANT
STRIP CLOSURE SKIN 1/2X4 (GAUZE/BANDAGES/DRESSINGS) ×2 IMPLANT
SUT PROLENE 6 0 BV (SUTURE) ×4 IMPLANT
SUT VIC AB 0 CT1 18XCR BRD8 (SUTURE) ×1 IMPLANT
SUT VIC AB 0 CT1 8-18 (SUTURE) ×2
SUT VIC AB 2-0 CP2 18 (SUTURE) ×2 IMPLANT
SUT VIC AB 3-0 SH 8-18 (SUTURE) ×2 IMPLANT
SYR 5ML LL (SYRINGE) IMPLANT
TOWEL OR 17X24 6PK STRL BLUE (TOWEL DISPOSABLE) ×2 IMPLANT
TOWEL OR 17X26 10 PK STRL BLUE (TOWEL DISPOSABLE) ×2 IMPLANT
WATER STERILE IRR 1000ML POUR (IV SOLUTION) ×2 IMPLANT

## 2015-01-12 NOTE — Anesthesia Procedure Notes (Signed)
Procedure Name: Intubation Date/Time: 01/12/2015 11:03 AM Performed by: Dairl PonderJIANG, Haston Casebolt Pre-anesthesia Checklist: Patient identified, Timeout performed, Emergency Drugs available, Suction available and Patient being monitored Patient Re-evaluated:Patient Re-evaluated prior to inductionOxygen Delivery Method: Circle system utilized Preoxygenation: Pre-oxygenation with 100% oxygen Intubation Type: IV induction Ventilation: Mask ventilation without difficulty, Oral airway inserted - appropriate to patient size and Two handed mask ventilation required Laryngoscope Size: Mac and 4 Grade View: Grade III Tube type: Oral Tube size: 8.0 mm Number of attempts: 1 Airway Equipment and Method: Stylet Placement Confirmation: ETT inserted through vocal cords under direct vision,  positive ETCO2 and breath sounds checked- equal and bilateral Secured at: 24 cm Tube secured with: Tape Dental Injury: Teeth and Oropharynx as per pre-operative assessment

## 2015-01-12 NOTE — Op Note (Signed)
NAMGirtha Hake:  Tyler Wood, Tyler Wood                ACCOUNT NO.:  [id number]  MEDICAL RECORD NO.:  [id number]  LOCATION:  5C12C                        FACILITY:  [facility]  PHYSICIAN:  Hilda LiasErnesto Yeimi Debnam, M.D.   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  Status post L3-4, L4-5, and L5-S1 fusion.  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  Status post L3-4, L4-5, and L5-S1 fusion.  PROCEDURE:  Right L5-S1 lysis of adhesions, foraminotomy, decompression of the L5 and S1 nerve root.  Repair of durotomy.  Microscopic.  SURGEON:  Hilda LiasErnesto Makynleigh Breslin, M.D.  ASSISTANT:  Dr. Lovell SheehanJenkins.  CLINICAL HISTORY:  Mr. Tyler Wood is a gentleman, who in the past underwent fusion in the lumbar area at 3 levels.  He did well, but lately he had been complaining of burning sensation, weakness of the right foot.  He has failed conservative treatment.  X-rays show good alignment. 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  He has epidural injection in the right side at the level L5-S1, which helped with the pain for at least 5 days. Because of that, the pain medication was off.  The pain came back.  We agreed with surgery.  He was scheduled for last week, but his insurance company did not approve.  Today, he is being taken to surgery.  He and his wife know the risk and benefits.  DESCRIPTION OF PROCEDURE:  The patient was taken to the OR and he was positioned in a prone manner.  The back was cleaned with dura prep.  The gentleman is quite large.  He was difficult to feel any bony structure. We did an incision resecting the scar in the lower part of the wound through the skin, subcu tissue to a thick layer of muscle.  The patient had quite a bit of  bleeding in that area.  Hemostasis was done with bipolar.  We did several x-ray and it did at the end showed that we were right at the level L5-S1.  The patient had quite adhesion and it was difficult to see the dura mater.  All the area where he, the nerve was. The patient had quite a bit of hypertrophy of the inferior facet of L5. With the drill, we were able to drill all the way laterally.  We decompressed the S1 nerve root.  The L5 nerve root was quite compromised in the canal, with a lot of fibrosis.  Lysis was accomplished and using the drill as well as the 2 and 3 mm Kerrison punch.  We were able to decompress thel5 nerve root all the way down to the ganglion. Then, we found that he has an opening in the midline probably from dissection of the dura mater away from the scar tissue.  The dura mater was closed with a single layer running stitch of 6-0 Prolene.  Valsalva maneuver was negative twice up to 40.  Piece of DuraGen was left  on top of the wound.  Hemostasis was done, again with bipolar.  We closed the wound with several layer of Vicryl and the skin with staples and Dermabond.  The patient is going to remain in bed rest for at least 48 hours.  After surgery, the patient woke up with no weakness whatsoever.          ______________________________ Hilda Lias, M.D.     EB/MEDQ  D:  01/12/2015  T:  01/12/2015  Job:  [id number]

## 2015-01-12 NOTE — Progress Notes (Signed)
Patient ID: Tyler Wood, male   DOB: 1965/04/10, 49 y.o.   MRN: [id number] Quite a lot of spasm and incisional pain. No leg pain, no weakness. Extra 10 mgs of iv valium given in my presence. To change to fentanyl. See new orders

## 2015-01-12 NOTE — Transfer of Care (Signed)
Immediate Anesthesia Transfer of Care Note  Patient: Tyler Wood  Procedure(s) Performed: Procedure(s): right Lumbar five -sacral one Foraminotomy (Right)  Patient Location: PACU  Anesthesia Type:General  Level of Consciousness: awake, alert  and oriented  Airway & Oxygen Therapy: Patient Spontanous Breathing and Patient connected to nasal cannula oxygen  Post-op Assessment: Report given to RN and Post -op Vital signs reviewed and stable  Post vital signs: Reviewed and stable  Last Vitals:  Filed Vitals:   01/12/15 0719  BP: 164/95  Pulse: 108  Temp: 37.2 C  Resp: 20    Complications: No apparent anesthesia complications

## 2015-01-12 NOTE — H&P (Signed)
Tyler Wood is an 49 y.o. male.   Chief Complaint: right leg pain HPI: patient who underwent 3 level surgical fusion. Did well but developed pain, swelling and burning sensation with no improvemeny with conservative treatment. Had a myelo which showed stenosis at right l5si . Epidural infiltration gave relief of the pain for 4 days.  Past Medical History  Diagnosis Date  . Hypertension   . Arthritis     back  . GERD (gastroesophageal reflux disease)   . Sleep apnea     Uses CPAP    Past Surgical History  Procedure Laterality Date  . Back surgery      Fused on Feb. 17, 2016    History reviewed. No pertinent family history. Social History:  reports that he has been smoking.  He has never used smokeless tobacco. He reports that he drinks about 1.2 oz of alcohol per week. He reports that he does not use illicit drugs.  Allergies: No Known Allergies  Medications Prior to Admission  Medication Sig Dispense Refill  . cyclobenzaprine (FLEXERIL) 10 MG tablet Take 10 mg by mouth 3 (three) times daily as needed for muscle spasms.    Marland Kitchen. lisinopril (PRINIVIL,ZESTRIL) 20 MG tablet Take 20 mg by mouth daily.    . Multiple Vitamins-Minerals (MULTIVITAMIN WITH MINERALS) tablet Take 1 tablet by mouth daily.    Marland Kitchen. omeprazole (PRILOSEC) 20 MG capsule Take 20 mg by mouth daily.    Marland Kitchen. oxyCODONE 20 MG TABS Take 1 tablet (20 mg total) by mouth every 4 (four) hours as needed for severe pain. 90 tablet 0  . aspirin 81 MG tablet Take 81 mg by mouth daily.    Marland Kitchen. gabapentin (NEURONTIN) 600 MG tablet Take 600 mg by mouth 3 (three) times daily.    . Omega-3 Fatty Acids (FISH OIL PO) Take 1 capsule by mouth daily.    Marland Kitchen. oxyCODONE-acetaminophen (PERCOCET) 10-325 MG per tablet Take 1 tablet by mouth every 6 (six) hours as needed for pain.      Results for orders placed or performed during the hospital encounter of 01/12/15 (from the past 48 hour(s))  Glucose, capillary     Status: Abnormal   Collection Time:  01/12/15  7:26 AM  Result Value Ref Range   Glucose-Capillary 184 (H) 65 - 99 mg/dL  Glucose, capillary     Status: Abnormal   Collection Time: 01/12/15  9:32 AM  Result Value Ref Range   Glucose-Capillary 155 (H) 65 - 99 mg/dL   No results found.  Review of Systems  Constitutional: Negative.   Eyes: Negative.   Respiratory: Negative.   Cardiovascular: Positive for leg swelling.  Gastrointestinal: Negative.   Genitourinary: Negative.   Musculoskeletal: Positive for back pain.  Skin: Negative.   Neurological: Positive for sensory change and focal weakness.  Endo/Heme/Allergies: Negative.   Psychiatric/Behavioral: Negative.     Blood pressure 164/95, pulse 108, temperature 98.9 F (37.2 C), temperature source Oral, resp. rate 20, weight 117.935 kg (260 lb), SpO2 97 %. Physical Exam  Hent, nl. Neck, nl. Cv, nl. Lungs, clear. Abdomen, nl. Extremities grade 1 edema. NEURO weakness of right foot df, hypersensitivity of l5. Assessment/Plan Ex[ploration of the right l5s1 area with or without removal of screws. He and his wife are aware of risks and benefits  Dhalia Zingaro M 01/12/2015, 10:42 AM

## 2015-01-12 NOTE — Anesthesia Preprocedure Evaluation (Addendum)
Anesthesia Evaluation  Patient identified by MRN, date of birth, ID band Patient awake    Reviewed: Allergy & Precautions, NPO status , Patient's Chart, lab work & pertinent test results  Airway Mallampati: II  TM Distance: >3 FB Neck ROM: Full    Dental no notable dental hx. (+) Teeth Intact   Pulmonary sleep apnea , Current Smoker,    breath sounds clear to auscultation       Cardiovascular hypertension,  Rhythm:Regular Rate:Normal     Neuro/Psych negative neurological ROS  negative psych ROS   GI/Hepatic Neg liver ROS, GERD  Medicated and Controlled,  Endo/Other  negative endocrine ROSdiabetes, Poorly Controlled, Type 2  Renal/GU negative Renal ROS  negative genitourinary   Musculoskeletal  (+) Arthritis , Osteoarthritis,  DDD lumbar spine   Abdominal   Peds  Hematology negative hematology ROS (+)   Anesthesia Other Findings   Reproductive/Obstetrics                            Anesthesia Physical  Anesthesia Plan  ASA: III  Anesthesia Plan: General   Post-op Pain Management:    Induction: Intravenous  Airway Management Planned: Oral ETT  Additional Equipment:   Intra-op Plan:   Post-operative Plan: Extubation in OR  Informed Consent: I have reviewed the patients History and Physical, chart, labs and discussed the procedure including the risks, benefits and alternatives for the proposed anesthesia with the patient or authorized representative who has indicated his/her understanding and acceptance.   Dental advisory given  Plan Discussed with: CRNA, Anesthesiologist and Surgeon  Anesthesia Plan Comments:         Anesthesia Quick Evaluation

## 2015-01-12 NOTE — Anesthesia Postprocedure Evaluation (Signed)
Anesthesia Post Note  Patient: Tyler Wood  Procedure(s) Performed: Procedure(s) (LRB): right Lumbar five -sacral one Foraminotomy (Right)  Patient location during evaluation: PACU Anesthesia Type: General Level of consciousness: awake and alert, sedated and patient cooperative Pain management: satisfactory to patient Vital Signs Assessment: post-procedure vital signs reviewed and stable Respiratory status: spontaneous breathing and respiratory function stable Cardiovascular status: stable Anesthetic complications: no    Last Vitals:  Filed Vitals:   01/12/15 1700 01/12/15 1715  BP: 143/90   Pulse: 107 106  Temp:  36.7 C  Resp: 30 49    Last Pain:  Filed Vitals:   01/12/15 1724  PainSc: 9                  [name],JAMES TERRILL

## 2015-01-13 MED ORDER — FENTANYL CITRATE (PF) 100 MCG/2ML IJ SOLN: 50.0000 ug | INTRAMUSCULAR | Status: AC | PRN

## 2015-01-13 MED ORDER — HALOPERIDOL LACTATE 5 MG/ML IJ SOLN
5.0000 mg | Freq: Once | INTRAMUSCULAR | Status: AC
  Administered 2015-01-13: 5 mg via INTRAVENOUS
  Filled 2015-01-13: qty 1

## 2015-01-13 MED ORDER — FENTANYL CITRATE (PF) 100 MCG/2ML IJ SOLN
50.0000 ug | INTRAMUSCULAR | Status: AC
Start: 2015-01-13 — End: 2015-01-13
  Administered 2015-01-13: 50 ug via INTRAVENOUS
  Filled 2015-01-13 (×2): qty 2

## 2015-01-13 MED ORDER — FENTANYL CITRATE (PF) 100 MCG/2ML IJ SOLN: 100.0000 ug | Freq: Once | INTRAMUSCULAR | Status: DC

## 2015-01-13 MED ORDER — FENTANYL 40 MCG/ML IV SOLN
INTRAVENOUS | Status: DC
  Administered 2015-01-13: 225 ug via INTRAVENOUS
  Administered 2015-01-13: 13:00:00 via INTRAVENOUS
  Administered 2015-01-13: 315 ug via INTRAVENOUS
  Administered 2015-01-13: 255 ug via INTRAVENOUS
  Administered 2015-01-14: 02:00:00 via INTRAVENOUS
  Administered 2015-01-14: 300 ug via INTRAVENOUS
  Administered 2015-01-14: 150 ug via INTRAVENOUS
  Filled 2015-01-13 (×2): qty 25

## 2015-01-13 MED ORDER — FENTANYL CITRATE (PF) 100 MCG/2ML IJ SOLN
50.0000 ug | Freq: Once | INTRAMUSCULAR | Status: AC | PRN
  Administered 2015-01-14: 50 ug via INTRAVENOUS
  Filled 2015-01-13: qty 2

## 2015-01-13 NOTE — Progress Notes (Addendum)
Patient received from [facility]. Abdomen distended. Per report patient had not voided since surgery. Bladder scan revealed >999. Foley placed per Dr. Cassandria SanteeBotero's order for immobility as patient has dural tear and will require flat bedrest. 1200ml urine returned. PCA settings confirmed with Ezequiel Essex. Godfrey, RN. Pain uncontrolled. Spoke with m/s RN and he stated the patient had NOT been given loading doses of fentanyl PCA when started. Patient given 2 loading doses per order. Will monitor

## 2015-01-13 NOTE — Progress Notes (Addendum)
Patient uses cpap at night. Offered cpap to patient since patient is sleeping, patient declined cpap at this time.

## 2015-01-13 NOTE — Progress Notes (Signed)
Pt transferred to 102M02 per Dr Jeral FruitBotero for pain control. Pt had been receiving MS via PCA without relief at begininig of shift, order received to change PCA to Dilaudid which still was not controlling pain, x2 Percocet 5-325 administered w/o relief. Dr Jeral FruitBotero came to bedside approx 2150 and ordered 5mg  IV Valium which was administered without relief also order received for Foley catheter, Pt initially refused Foley to be placed but did agree to placement a short time later, however was not completed prior to transfer. Dr Jeral FruitBotero again at bedside 2300 hrs, ordered additional 5mg  valium IV and 5 minutes later an additional 5mg  Valium IV, and changed order for PCA to Fentanyl. PCA was changed to Fentanyl at 0005hrs, received call from Dr Jeral FruitBotero to transfer Pt to 102M for further pain control at 0005hrs while changing PCA. Report to RN on 102M a short time later, Pt transferred to 102M02.

## 2015-01-13 NOTE — Progress Notes (Signed)
Patient ID: Tyler Wood, male   DOB: 21-Dec-1965, 49 y.o.   MRN: [id number] Pain better. Pharmacy helping. No leg pain as preop. No headache. Continue bed rest till monday

## 2015-01-13 NOTE — Progress Notes (Signed)
Patient ID: Tyler Wood, male   DOB: Oct 14, 1965, 49 y.o.   MRN: [id number] i am not on call but please call me about Tyler Wood anytime at [phone]

## 2015-01-13 NOTE — Progress Notes (Signed)
Patient ID: Tyler Wood, male   DOB: 05-24-1965, 49 y.o.   MRN: [id number] Still poor pain control.fentanyl and iv valium not helping. Spoke with RN  In charge of the neuro ICU to transfer mr Heyboer there. RN i did call mr Gean QuintJeff Simpson RN who is taking care of him to make him aware of the transfer

## 2015-01-13 NOTE — Progress Notes (Signed)
Per Dr. Cassandria SanteeBotero's orders, pharmacy called for pain management dosing of PCA. Patient's pain uncontrolled. Patient still writhing in pain. Pharmacist changed order. Will monitor and attempt to control pain

## 2015-01-13 NOTE — Progress Notes (Signed)
Pt's post operative pain not controlled by morphine PCA. Changed to Dilaudid PCA per telephone orders from Dr. Jeral FruitBotero. Lawson RadarHeather M Elida Harbin

## 2015-01-13 NOTE — Progress Notes (Signed)
Patient ID: Tyler Wood, male   DOB: 1965/03/15, 49 y.o.   MRN: [id number] See progress note

## 2015-01-13 NOTE — Progress Notes (Signed)
Spoke with Dr. Jeral FruitBotero about patient's uncontrolled pain and agitation despite pharmacy change in dosing. Order received for 5 of haldol for agitation. Clarified safety of order with Rolan BuccoCaron in the pharmacy. She stated it would safe to administer.

## 2015-01-14 MED ORDER — OXYCODONE HCL 5 MG PO TABS
20.0000 mg | ORAL_TABLET | ORAL | Status: DC | PRN
  Administered 2015-01-14 – 2015-01-15 (×3): 20 mg via ORAL
  Filled 2015-01-14 (×3): qty 4

## 2015-01-14 MED ORDER — ALUM & MAG HYDROXIDE-SIMETH 200-200-20 MG/5ML PO SUSP
30.0000 mL | Freq: Four times a day (QID) | ORAL | Status: DC | PRN
  Administered 2015-01-14: 30 mL via ORAL
  Filled 2015-01-14: qty 30

## 2015-01-14 NOTE — Progress Notes (Signed)
Wasted 10 cc of Fentanyl   PCA  In sink, witness Linna HoffMelissa ELzondo

## 2015-01-14 NOTE — Progress Notes (Signed)
Subjective: Patient reports "I want to get out of here."  Objective: Vital signs in last 24 hours: Temp:  [97.8 F (36.6 C)-98.8 F (37.1 C)] 98.6 F (37 C) (01/01 0515) Pulse Rate:  [56-104] 88 (01/01 0515) Resp:  [15-26] 18 (01/01 0820) BP: (92-144)/(60-94) 133/72 mmHg (01/01 0515) SpO2:  [95 %-100 %] 95 % (01/01 0820)  Intake/Output from previous day: 12/31 0701 - 01/01 0700 In: 1578 [I.V.:1578] Out: 3145 [Urine:3145] Intake/Output this shift:    Physical Exam: Patient non-compliant with bedrest order.  Sitting up.  Says no headache.  Wants to go home.  Dressing flat.  I advised patient that I could not discharge home with PCA. Denies leg pain.  Lab Results:  Recent Labs  01/12/15 1935  WBC 14.5*  HGB 12.8*  HCT 37.9*  PLT 174   BMET  Recent Labs  01/12/15 1935  CREATININE 1.13    Studies/Results: Dg Lumbar Spine 2-3 Views  01/12/2015  CLINICAL DATA:  Initial encounter for L5-S1 foraminotomy EXAM: LUMBAR SPINE - 2-3 VIEW COMPARISON:  Intraoperative films from 11/02/2014. CT scan from 11/02/2014. FINDINGS: Portable cross-table lateral film obtained at 1133 hours is labeled 1. Same numbering scheme used today as on the previous CT scan. Patient has had discectomy with interbody fusion at L3-4 and L4-5. Pedicle screws are seen at the L3, L4, L5, and S1 levels. Soft tissue retractors are seen in the posterior lower back. Surgical probe is positioned with the tip just posterior to the S2 segment. A second cross-table lateral film obtained portably at 1138 hours is labeled 2. Surgical probe now seen with tip positioned between the L5 and S1 pedicle screws. Soft tissue retractors are again noted. Surgical sponge is visible in the operative bed. IMPRESSION: Intraoperative localization. Electronically Signed   By: Kennith CenterEric  Mansell M.D.   On: 01/12/2015 14:55    Assessment/Plan: Patient wants to get up.  Not compliant with orders.  Will D/C PCA and mobilize with PT.  Start po  pain meds.  IF doing OK, may D/C in AM.    LOS: 2 days    Bryer Gottsch D, MD 01/14/2015, 9:46 AM

## 2015-01-14 NOTE — Progress Notes (Signed)
Found patient sitting up on side of bed not following MD orders of being flat. Explained to patient sated he understood but just need to sit up

## 2015-01-14 NOTE — Progress Notes (Signed)
Patient found sitting up again on side of bed this time he stated he just wanted to sit up  MD when  making rounds was informed of this  Plan as ordered for changes in care

## 2015-01-14 NOTE — Progress Notes (Signed)
Pt arrived on unit from ICU approx 0540 hrs, A&O, pain somewhat controlled, Pt oriented to room and equipment,

## 2015-01-15 LAB — POCT I-STAT EG7
ACID-BASE DEFICIT: 3 mmol/L — AB (ref 0.0–2.0)
Bicarbonate: 25.5 mEq/L — ABNORMAL HIGH (ref 20.0–24.0)
CALCIUM ION: 1.23 mmol/L (ref 1.12–1.23)
HCT: 41 % (ref 39.0–52.0)
HEMOGLOBIN: 13.9 g/dL (ref 13.0–17.0)
O2 SAT: 99 %
PH VEN: 7.267 (ref 7.250–7.300)
PO2 VEN: 154 mmHg — AB (ref 30.0–45.0)
Potassium: 4.8 mmol/L (ref 3.5–5.1)
Sodium: 137 mmol/L (ref 135–145)
TCO2: 27 mmol/L (ref 0–100)
pCO2, Ven: 55.3 mmHg — ABNORMAL HIGH (ref 45.0–50.0)

## 2015-01-15 MED ORDER — DIAZEPAM 5 MG PO TABS: 5.0000 mg | ORAL_TABLET | Freq: Four times a day (QID) | ORAL | Status: DC | PRN

## 2015-01-15 MED ORDER — OXYCODONE-ACETAMINOPHEN 5-325 MG PO TABS: 1.0000 | ORAL_TABLET | ORAL | Status: DC | PRN

## 2015-01-15 NOTE — Progress Notes (Signed)
PT Cancellation Note  Patient Details Name: Tyler Wood MRN: [id number] DOB: May 03, 1965   Cancelled Treatment:    Reason Eval/Treat Not Completed: PT screened, no needs identified, will sign off. Pt independent with mobility.   [name] 01/15/2015, 10:35 AM Skip Mayerary Ruffin Lada PT [phone]

## 2015-01-15 NOTE — Care Management Note (Signed)
Case Management Note  Patient Details  Name: Tyler Wood MRN: [id number] Date of Birth: 24-Nov-1965  Subjective/Objective:                    Action/Plan: Plan is for patient to be discharged home with his wife. No further needs per CM.   Expected Discharge Date:                  Expected Discharge Plan:  Home/Self Care  In-House Referral:     Discharge planning Services     Post Acute Care Choice:    Choice offered to:     DME Arranged:    DME Agency:     HH Arranged:    HH Agency:     Status of Service:  Completed, signed off  Medicare Important Message Given:    Date Medicare IM Given:    Medicare IM give by:    Date Additional Medicare IM Given:    Additional Medicare Important Message give by:     If discussed at Long Length of Stay Meetings, dates discussed:    Additional Comments:  Kermit BaloKelli F Kaylen Nghiem, RN 01/15/2015, 11:15 AM

## 2015-01-15 NOTE — Discharge Summary (Signed)
Physician Discharge Summary  Patient ID: Tyler Wood MRN: [id number] DOB/AGE: 1965-05-02 50 y.o.  Admit date: 01/12/2015 Discharge date: 01/15/2015  Admission Diagnoses: Lumbar radiculopathy with foraminal stenosis status post arthrodesis L3 to sacrum  Discharge Diagnoses: [name] radiculopathy with foraminal stenosis, status post arthrodesis L3 to sacrum. Active Problems:   Lumbar stenosis   Discharged Condition: fair  Hospital Course: Patient was admitted to undergo surgical exploration of his lumbar fusion with decompression of the nerve roots. During surgery he had [place] spinal fluid leak. This was closed primarily. The patient had been at bedrest for several days but now is ambulatory feeling better and would like to be discharged home.  Consults: None  Significant Diagnostic Studies: None  Treatments: surgery: Exploration of lumbar fusion decompression of nerve roots with laminotomy foraminotomy, dural closure  Discharge Exam: Blood pressure 137/71, pulse 106, temperature 98.1 F (36.7 C), temperature source Oral, resp. rate 20, height 6\' 6"  (1.981 m), weight 117.935 kg (260 lb), SpO2 99 %. incision is cmotor function is intact in lower extremities.  Disposition: Discharge home  Discharge Instructions    Call MD for:  redness, tenderness, or signs of infection (pain, swelling, redness, odor or green/yellow discharge around incision site)    Complete by:  As directed      Call MD for:  severe uncontrolled pain    Complete by:  As directed      Call MD for:  temperature >100.4    Complete by:  As directed      Diet - low sodium heart healthy    Complete by:  As directed      Discharge instructions    Complete by:  As directed   Okay to shower. Do not apply salves or appointments to incision. No heavy lifting with the upper extremities greater than 15 pounds. May resume driving when not requiring pain medication and patient feels comfortable with doing so.     Increase  activity slowly    Complete by:  As directed             Medication List    TAKE these medications        aspirin 81 MG tablet  Take 81 mg by mouth daily.     cyclobenzaprine 10 MG tablet  Commonly known as:  FLEXERIL  Take 10 mg by mouth 3 (three) times daily as needed for muscle spasms.     diazepam 5 MG tablet  Commonly known as:  VALIUM  Take 1 tablet (5 mg total) by mouth every 6 (six) hours as needed for anxiety.     FISH OIL PO  Take 1 capsule by mouth daily.     gabapentin 600 MG tablet  Commonly known as:  NEURONTIN  Take 600 mg by mouth 3 (three) times daily.     lisinopril 20 MG tablet  Commonly known as:  PRINIVIL,ZESTRIL  Take 20 mg by mouth daily.     multivitamin with minerals tablet  Take 1 tablet by mouth daily.     omeprazole 20 MG capsule  Commonly known as:  PRILOSEC  Take 20 mg by mouth daily.     Oxycodone HCl 20 MG Tabs  Take 1 tablet (20 mg total) by mouth every 4 (four) hours as needed for severe pain.     oxyCODONE-acetaminophen 10-325 MG tablet  Commonly known as:  PERCOCET  Take 1 tablet by mouth every 6 (six) hours as needed for pain.     oxyCODONE-acetaminophen 5-325  MG tablet  Commonly known as:  PERCOCET/ROXICET  Take 1-2 tablets by mouth every 4 (four) hours as needed for moderate pain.         SignedStefani Dama: Tenita Cue J 01/15/2015, 11:07 AM

## 2015-01-15 NOTE — Progress Notes (Signed)
Discharge instructions reviewed with patient/family. All questions answered at this time. RX given. Transport home by family.   Orilla Templeman, RN 

## 2015-01-16 ENCOUNTER — Encounter (HOSPITAL_COMMUNITY): Payer: Self-pay | Admitting: Neurosurgery

## 2015-01-22 ENCOUNTER — Inpatient Hospital Stay (HOSPITAL_COMMUNITY): Payer: BLUE CROSS/BLUE SHIELD | Admitting: Anesthesiology

## 2015-01-22 ENCOUNTER — Encounter (HOSPITAL_COMMUNITY): Payer: Self-pay | Admitting: Family Medicine

## 2015-01-22 ENCOUNTER — Inpatient Hospital Stay (HOSPITAL_COMMUNITY)
Admission: EM | Admit: 2015-01-22 | Discharge: 2015-01-26 | DRG: 092 | Disposition: A | Discharge status: Home / Self Care | Payer: BLUE CROSS/BLUE SHIELD | Attending: Neurosurgery | Admitting: Neurosurgery

## 2015-01-22 DIAGNOSIS — F17200 Nicotine dependence, unspecified, uncomplicated: Secondary | ICD-10-CM | POA: Diagnosis present

## 2015-01-22 DIAGNOSIS — Z7982 Long term (current) use of aspirin: Secondary | ICD-10-CM

## 2015-01-22 DIAGNOSIS — Y838 Other surgical procedures as the cause of abnormal reaction of the patient, or of later complication, without mention of misadventure at the time of the procedure: Secondary | ICD-10-CM | POA: Diagnosis present

## 2015-01-22 DIAGNOSIS — G4730 Sleep apnea, unspecified: Secondary | ICD-10-CM | POA: Diagnosis present

## 2015-01-22 DIAGNOSIS — K219 Gastro-esophageal reflux disease without esophagitis: Secondary | ICD-10-CM | POA: Diagnosis present

## 2015-01-22 DIAGNOSIS — Z981 Arthrodesis status: Secondary | ICD-10-CM

## 2015-01-22 DIAGNOSIS — R208 Other disturbances of skin sensation: Secondary | ICD-10-CM | POA: Diagnosis not present

## 2015-01-22 DIAGNOSIS — G960 Cerebrospinal fluid leak: Principal | ICD-10-CM | POA: Diagnosis present

## 2015-01-22 DIAGNOSIS — G9782 Other postprocedural complications and disorders of nervous system: Secondary | ICD-10-CM | POA: Diagnosis present

## 2015-01-22 DIAGNOSIS — M4690 Unspecified inflammatory spondylopathy, site unspecified: Secondary | ICD-10-CM | POA: Diagnosis present

## 2015-01-22 DIAGNOSIS — R51 Headache: Secondary | ICD-10-CM | POA: Diagnosis present

## 2015-01-22 DIAGNOSIS — I10 Essential (primary) hypertension: Secondary | ICD-10-CM | POA: Diagnosis present

## 2015-01-22 MED ORDER — GABAPENTIN 600 MG PO TABS
600.0000 mg | ORAL_TABLET | Freq: Three times a day (TID) | ORAL | Status: DC
  Administered 2015-01-22: 600 mg via ORAL
  Filled 2015-01-22 (×5): qty 1

## 2015-01-22 MED ORDER — ONDANSETRON HCL 4 MG/2ML IJ SOLN: 4.0000 mg | Freq: Four times a day (QID) | INTRAMUSCULAR | Status: DC | PRN

## 2015-01-22 MED ORDER — SODIUM CHLORIDE 0.9 % IV SOLN: INTRAVENOUS | Status: DC

## 2015-01-22 MED ORDER — LISINOPRIL 20 MG PO TABS
20.0000 mg | ORAL_TABLET | Freq: Every day | ORAL | Status: DC
  Administered 2015-01-23 – 2015-01-26 (×4): 20 mg via ORAL
  Filled 2015-01-22 (×4): qty 1

## 2015-01-22 MED ORDER — FENTANYL 40 MCG/ML IV SOLN
INTRAVENOUS | Status: DC
  Administered 2015-01-22: 105 ug via INTRAVENOUS
  Administered 2015-01-22: 25 mL via INTRAVENOUS
  Administered 2015-01-23: 111.9 ug via INTRAVENOUS
  Administered 2015-01-23: 30 ug via INTRAVENOUS
  Administered 2015-01-23: 255 ug via INTRAVENOUS
  Administered 2015-01-23: 420 ug via INTRAVENOUS
  Administered 2015-01-23: 240 ug via INTRAVENOUS
  Administered 2015-01-23: 105 ug via INTRAVENOUS
  Administered 2015-01-24: 195 ug via INTRAVENOUS
  Administered 2015-01-24 (×2): 105 ug via INTRAVENOUS
  Administered 2015-01-24: 15:00:00 via INTRAVENOUS
  Administered 2015-01-25 (×2): 105 ug via INTRAVENOUS
  Administered 2015-01-25: 16:00:00 via INTRAVENOUS
  Administered 2015-01-25 (×2): 165 ug via INTRAVENOUS
  Administered 2015-01-25: 120 ug via INTRAVENOUS
  Administered 2015-01-26: 325 ug via INTRAVENOUS
  Administered 2015-01-26: 105 ug via INTRAVENOUS
  Administered 2015-01-26 (×2): 120 ug via INTRAVENOUS
  Administered 2015-01-26: 195 ug via INTRAVENOUS
  Administered 2015-01-26: 45 ug via INTRAVENOUS
  Administered 2015-01-26: 14:00:00 via INTRAVENOUS
  Administered 2015-01-26: 180 ug via INTRAVENOUS
  Filled 2015-01-22 (×5): qty 25

## 2015-01-22 MED ORDER — CYCLOBENZAPRINE HCL 10 MG PO TABS
10.0000 mg | ORAL_TABLET | Freq: Three times a day (TID) | ORAL | Status: DC | PRN
  Administered 2015-01-22 – 2015-01-23 (×2): 10 mg via ORAL
  Filled 2015-01-22 (×2): qty 1

## 2015-01-22 MED ORDER — SODIUM CHLORIDE 0.9 % IV SOLN
Freq: Once | INTRAVENOUS | Status: AC
  Administered 2015-01-22: 11:00:00 via INTRAVENOUS

## 2015-01-22 MED ORDER — ONDANSETRON HCL 4 MG/2ML IJ SOLN: 4.0000 mg | Freq: Three times a day (TID) | INTRAMUSCULAR | Status: DC | PRN

## 2015-01-22 MED ORDER — DIPHENHYDRAMINE HCL 50 MG/ML IJ SOLN: 12.5000 mg | Freq: Four times a day (QID) | INTRAMUSCULAR | Status: DC | PRN

## 2015-01-22 MED ORDER — OXYCODONE HCL 5 MG PO TABS
15.0000 mg | ORAL_TABLET | ORAL | Status: DC | PRN
  Administered 2015-01-22 – 2015-01-26 (×18): 15 mg via ORAL
  Filled 2015-01-22 (×18): qty 3

## 2015-01-22 MED ORDER — DIPHENHYDRAMINE HCL 12.5 MG/5ML PO ELIX: 12.5000 mg | ORAL_SOLUTION | Freq: Four times a day (QID) | ORAL | Status: DC | PRN

## 2015-01-22 MED ORDER — HYDROMORPHONE HCL 1 MG/ML IJ SOLN: 1.0000 mg | INTRAMUSCULAR | Status: DC | PRN

## 2015-01-22 MED ORDER — ONDANSETRON HCL 4 MG/2ML IJ SOLN
4.0000 mg | Freq: Once | INTRAMUSCULAR | Status: AC
  Administered 2015-01-22: 4 mg via INTRAVENOUS
  Filled 2015-01-22: qty 2

## 2015-01-22 MED ORDER — ADULT MULTIVITAMIN W/MINERALS CH
1.0000 | ORAL_TABLET | Freq: Every day | ORAL | Status: DC
  Administered 2015-01-23 – 2015-01-26 (×4): 1 via ORAL
  Filled 2015-01-22 (×5): qty 1

## 2015-01-22 MED ORDER — PANTOPRAZOLE SODIUM 40 MG PO TBEC
40.0000 mg | DELAYED_RELEASE_TABLET | Freq: Every day | ORAL | Status: DC
  Administered 2015-01-23 – 2015-01-26 (×4): 40 mg via ORAL
  Filled 2015-01-22 (×4): qty 1

## 2015-01-22 MED ORDER — SODIUM CHLORIDE 0.9 % IJ SOLN: 9.0000 mL | INTRAMUSCULAR | Status: DC | PRN

## 2015-01-22 MED ORDER — ASPIRIN EC 81 MG PO TBEC
81.0000 mg | DELAYED_RELEASE_TABLET | Freq: Every day | ORAL | Status: DC
  Administered 2015-01-23 – 2015-01-26 (×4): 81 mg via ORAL
  Filled 2015-01-22 (×5): qty 1

## 2015-01-22 MED ORDER — NALOXONE HCL 0.4 MG/ML IJ SOLN: 0.4000 mg | INTRAMUSCULAR | Status: DC | PRN

## 2015-01-22 NOTE — ED Provider Notes (Signed)
CSN: [id number]     Arrival date & time 01/22/15  [id number] History   First MD Initiated Contact with Patient 01/22/15 0935     Chief Complaint  Patient presents with  . Back Pain  . Nausea  . Eye Problem     (Consider location/radiation/quality/duration/timing/severity/associated sxs/prior Treatment) HPI Patient presents with concern of ongoing leakage from his lumbar spine area, dysesthesia in both legs, nausea, vomiting. Patient has a notable history of surgical procedure January 12, 2015. This was done at one year after initial lumbar spine laminectomy. Patient notes that since discharge one week ago, he has had increasing dysesthesia in both lower extremity, persistent fluid leak. He also developed nausea, vomiting, but no fever, chills. There is blurred vision with upright positioning. No confusion, disorientation, chest pain, dyspnea. Beyond positioning, no other clear alleviating or exacerbating factors. Patient spoke with his neurosurgical team yesterday, was advised to come in for evaluation. Patient now presents today for evaluation.  Past Medical History  Diagnosis Date  . Hypertension   . Arthritis     back  . GERD (gastroesophageal reflux disease)   . Sleep apnea     Uses CPAP   Past Surgical History  Procedure Laterality Date  . Back surgery      Fused on Feb. 17, 2016  . Foraminotomy 1 level  01/12/2015    L5 S1  . Lumbar laminectomy/decompression microdiscectomy Right 01/12/2015    Procedure: right Lumbar five -sacral one Foraminotomy;  Surgeon: Hilda LiasErnesto Botero, MD;  Location: [facility];  Service: Neurosurgery;  Laterality: Right;   History reviewed. No pertinent family history. Social History  Substance Use Topics  . Smoking status: Current Every Day Smoker -- 1.50 packs/day for 25 years  . Smokeless tobacco: Never Used  . Alcohol Use: 1.2 oz/week    2 Cans of beer per week    Review of Systems  Constitutional:       Per HPI, otherwise negative    HENT:       Per HPI, otherwise negative  Respiratory:       Per HPI, otherwise negative  Cardiovascular:       Per HPI, otherwise negative  Gastrointestinal: Negative for vomiting.  Endocrine:       Negative aside from HPI  Genitourinary:       Neg aside from HPI   Musculoskeletal:       Per HPI, otherwise negative  Skin: Positive for wound.  Neurological: Negative for syncope.      Allergies  Review of patient's allergies indicates no known allergies.  Home Medications   Prior to Admission medications   Medication Sig Start Date End Date Taking? Authorizing Provider  aspirin 81 MG tablet Take 81 mg by mouth daily.   Yes Historical Provider, MD  cyclobenzaprine (FLEXERIL) 10 MG tablet Take 10 mg by mouth 3 (three) times daily as needed for muscle spasms.   Yes Historical Provider, MD  diazepam (VALIUM) 5 MG tablet Take 1 tablet (5 mg total) by mouth every 6 (six) hours as needed for anxiety. 01/15/15  Yes Barnett AbuHenry Elsner, MD  gabapentin (NEURONTIN) 600 MG tablet Take 600 mg by mouth 3 (three) times daily.   Yes Historical Provider, MD  lisinopril (PRINIVIL,ZESTRIL) 20 MG tablet Take 20 mg by mouth daily.   Yes Historical Provider, MD  Multiple Vitamins-Minerals (MULTIVITAMIN WITH MINERALS) tablet Take 1 tablet by mouth daily.   Yes Historical Provider, MD  Omega-3 Fatty Acids (FISH OIL PO) Take 1 capsule  by mouth daily.   Yes Historical Provider, MD  omeprazole (PRILOSEC) 20 MG capsule Take 20 mg by mouth daily.   Yes Historical Provider, MD  oxycodone (ROXICODONE) 30 MG immediate release tablet Take 30 mg by mouth every 6 (six) hours as needed for pain.   Yes Historical Provider, MD   BP 144/88 mmHg  Pulse 112  Temp(Src) 97.9 F (36.6 C)  SpO2 97% Physical Exam  Constitutional: He is oriented to person, place, and time. He appears well-developed. No distress.  HENT:  Head: Normocephalic and atraumatic.  Eyes: Conjunctivae and EOM are normal.  Cardiovascular: Normal rate  and regular rhythm.   Pulmonary/Chest: Effort normal. No stridor. No respiratory distress.  Abdominal: He exhibits no distension.  Musculoskeletal: He exhibits no edema.  Neurological: He is alert and oriented to person, place, and time.  Patient flexes and extends both hips independently, both knees, ankles without complication, described odd sensation in both legs diffusely, but has preserved sensation bilaterally.   Skin: Skin is warm and dry.     Psychiatric: He has a normal mood and affect.  Nursing note and vitals reviewed.   ED Course  Procedures (including critical care time)  EMR reviewed. Patient discharge, 1, 2, 2017 following lumbar spine surgery. Initial surgery was one year ago. Patient was admitted to undergo surgical exploration of his lumbar fusion with decompression of the nerve roots. During surgery he had [place] spinal fluid leak.   10:55 AM I discussed patient's case with his neurosurgeon. With concern for ongoing dysesthesia in both lower extremities, possible spinal fluid leak, the patient will be admitted for further evaluation, management. MDM  Patient presents with concern of dysesthesia in both lower extremities, following recent surgical lumbar spine procedure. Here, the patient has preserved motion in both legs, but dysesthesia throughout. Patient received antiemetics for ongoing vomiting, fluids for tachycardia. No fever, evidence for sepsis, but with concern for possible fluid leak, dysesthesia, the patient required admission for further evaluation and management.  Gerhard Munch, MD 01/22/15 1056

## 2015-01-22 NOTE — H&P (Signed)
Tyler Wood is an 50 y.o. male.   Chief Complaint: headache WUJ:[id number]:patient who underwent laminotomy at right l5s1  Followed by duratotomy. Patient did well but yesterday he developed headache and started to drain ofrm the lumbar wound. He was advised to come to the ER but waited till today  Past Medical History  Diagnosis Date  . Hypertension   . Arthritis     back  . GERD (gastroesophageal reflux disease)   . Sleep apnea     Uses CPAP    Past Surgical History  Procedure Laterality Date  . Back surgery      Fused on Feb. 17, 2016  . Foraminotomy 1 level  01/12/2015    L5 S1  . Lumbar laminectomy/decompression microdiscectomy Right 01/12/2015    Procedure: right Lumbar five -sacral one Foraminotomy;  Surgeon: Hilda LiasErnesto Dorman Calderwood, MD;  Location: [facility];  Service: Neurosurgery;  Laterality: Right;    History reviewed. No pertinent family history. Social History:  reports that he has been smoking.  He has never used smokeless tobacco. He reports that he drinks about 1.2 oz of alcohol per week. He reports that he does not use illicit drugs.  Allergies: No Known Allergies  Medications Prior to Admission  Medication Sig Dispense Refill  . aspirin 81 MG tablet Take 81 mg by mouth daily.    . cyclobenzaprine (FLEXERIL) 10 MG tablet Take 10 mg by mouth 3 (three) times daily as needed for muscle spasms.    . diazepam (VALIUM) 5 MG tablet Take 1 tablet (5 mg total) by mouth every 6 (six) hours as needed for anxiety. 60 tablet 0  . gabapentin (NEURONTIN) 600 MG tablet Take 600 mg by mouth 3 (three) times daily.    Marland Kitchen. lisinopril (PRINIVIL,ZESTRIL) 20 MG tablet Take 20 mg by mouth daily.    . Multiple Vitamins-Minerals (MULTIVITAMIN WITH MINERALS) tablet Take 1 tablet by mouth daily.    . Omega-3 Fatty Acids (FISH OIL PO) Take 1 capsule by mouth daily.    Marland Kitchen. omeprazole (PRILOSEC) 20 MG capsule Take 20 mg by mouth daily.    Marland Kitchen. oxycodone (ROXICODONE) 30 MG immediate release tablet Take 30 mg by  mouth every 6 (six) hours as needed for pain.      No results found for this or any previous visit (from the past 48 hour(s)). No results found.  Review of Systems  Constitutional: Negative.   Respiratory: Negative.   Cardiovascular: Negative.   Gastrointestinal: Negative.   Genitourinary: Negative.   Musculoskeletal: Positive for back pain.  Skin: Negative.   Neurological: Positive for headaches.  Endo/Heme/Allergies: Negative.   Psychiatric/Behavioral: Negative.     Blood pressure 136/85, pulse 101, temperature 97.9 F (36.6 C), temperature source Oral, resp. rate 21, SpO2 99 %. Physical Exam hent, nl. Neck, nl. Cv, nl. Lungs, clear. Abdomen,  Nl. Extremities, nl. Neuro  Staples in the lumbar area. Some SQ fluid   Assessment/Plan Patient to be transferred to the ICU for insertion of a lumbar drainage. Wife agrees with procedure  Jerrico Covello M 01/22/2015, 4:35 PM

## 2015-01-22 NOTE — Progress Notes (Signed)
Patient ID: Tyler Wood, male   DOB: 1965/06/12, 50 y.o.   MRN: [id number] Patient is going to be transferred to the nicu   i did talk with dr Okey Dupreose( anesthesia) who kindly will insert the lumbar catheter for us

## 2015-01-22 NOTE — ED Notes (Signed)
Pt here for back pain, blurry vision, N,V since waking up this am. sts leaking fluid from incision site where he had his back surgery. sts he felt fine yesterday. sts he feels like his legs wont work,.

## 2015-01-22 NOTE — Anesthesia Procedure Notes (Signed)
Lumbar drain Neurosurgery consulted us for placement of a lumbar drain due to CSF leak. Pt brought to the holding area and informed consent obtained. Routine monitors placed. Sterile prep and drape. 1% lidocaine used to anesthetize the skin and subcutaneous tissue. 14G Touhy needle was inserted at the L2-3 level. Clear CSF draining from needle. A catheter was inserted through the needle before the needle was removed. After removing the needle, the wire was removed from the catheter, and CSF continued to drain. The catheter was pulled back to 12cm at the skin.  A dressing was applied. Drainage tubing and reservoir to be hooked up in the ICU. No complications. Pt. tolerated the procedure well. We will continue to follow.

## 2015-01-22 NOTE — ED Notes (Signed)
EDP at bedside  

## 2015-01-23 MED ORDER — GABAPENTIN 300 MG PO CAPS
600.0000 mg | ORAL_CAPSULE | Freq: Three times a day (TID) | ORAL | Status: DC
  Administered 2015-01-23 – 2015-01-26 (×11): 600 mg via ORAL
  Filled 2015-01-23 (×11): qty 2

## 2015-01-23 MED ORDER — HEPARIN SODIUM (PORCINE) 5000 UNIT/ML IJ SOLN
5000.0000 [IU] | Freq: Three times a day (TID) | INTRAMUSCULAR | Status: DC
  Administered 2015-01-24 – 2015-01-26 (×9): 5000 [IU] via SUBCUTANEOUS
  Filled 2015-01-23 (×9): qty 1

## 2015-01-23 NOTE — Progress Notes (Signed)
Notified by patient that the lumbar drain tubing became disconnected. Anesthesiologist notified. Order to reconnect tubing. Will continue to monitor closely.

## 2015-01-23 NOTE — Progress Notes (Signed)
Pt is on CPAP at this time tolerating it well. Pt was placed on his home settings. Pt stated that he wears 10 of pressure. Pt is stable at this time. No complications or distress noted.

## 2015-01-23 NOTE — Anesthesia Postprocedure Evaluation (Signed)
Anesthesia Post Note  Patient: Tyler Wood  Procedure(s) Performed: * No procedures listed *  Patient location during evaluation: Other Anesthesia Type: Epidural Level of consciousness: awake and alert and oriented Pain management: pain level controlled Vital Signs Assessment: post-procedure vital signs reviewed and stable Cardiovascular status: stable Anesthetic complications: no Comments: Lumbar drain continues to drain adequately. Dressing clean and intact..    Last Vitals:  Filed Vitals:   01/23/15 1112 01/23/15 1200  BP: 116/92   Pulse:    Temp:  36.4 C  Resp:      Last Pain:  Filed Vitals:   01/23/15 1209  PainSc: 7                  Srishti Strnad A.

## 2015-01-23 NOTE — Progress Notes (Signed)
Patient ID: Tyler Wood, male   DOB: May 23, 1965, 50 y.o.   MRN: [id number] Resting, lumbar cath draining well. Needs the hob down to 5 degrres with no pillow or 10 degrees with one. Neuro stable. Spoke with wife

## 2015-01-23 NOTE — Care Management Note (Signed)
Case Management Note  Patient Details  Name: Salley Slaughteratrick S Tweedy MRN: [id number] Date of Birth: 30-Dec-1965  Subjective/Objective:   Pt admitted on 01/22/15 s/p Rt L5-S1 lysis of adhesions, foraminotomy, decompression of L5 and S1 nerve root, and lumbar drain placement.  PTA, pt independent, lives with spouse.                   Action/Plan: Will follow for discharge planning as pt progresses.    Expected Discharge Date:                  Expected Discharge Plan[facility]   [facility]    In-House Referral:     Discharge planning Services     Post Acute Care Choice:    Choice offered to:     DME Arranged:    DME Agency:     HH Arranged:    HH Agency:     Status of Service:   In process, will continue to follow  Medicare Important Message Given:    Date Medicare IM Given:    Medicare IM give by:    Date Additional Medicare IM Given:    Additional Medicare Important Message give by:     If discussed at Long Length of Stay Meetings, dates discussed:    Additional Comments:  Quintella BatonJulie W. Jd Mccaster, RN, BSN  Trauma/Neuro ICU Case Manager [phone]

## 2015-01-23 NOTE — Progress Notes (Signed)
Patient ID: Tyler Wood, male   DOB: [birth year], 50 y.o.   MRN: [id number] Drain working well

## 2015-01-23 NOTE — Progress Notes (Signed)
Dr. Malen GauzeFoster notified of pink tinged CSF. No new orders at this time. Will continue to monitor closely.

## 2015-01-24 MED ORDER — DIAZEPAM 5 MG PO TABS
10.0000 mg | ORAL_TABLET | Freq: Four times a day (QID) | ORAL | Status: DC | PRN
  Administered 2015-01-24 – 2015-01-26 (×5): 10 mg via ORAL
  Filled 2015-01-24 (×5): qty 2

## 2015-01-24 NOTE — Progress Notes (Deleted)
  Day 3 of lumbar spinal drain for dural leak.   Blood pressure 137/71, pulse 106, temperature 36.7 C, temperature source Oral, resp. rate 20, height 6\' 6"  (1.981 m), weight 260 lb (117.935 kg), SpO2 99 %.  Lumbar drain in place and well working. Site clean dry and intact. Will continue until further direction from Dr Jeral FruitBotero.  Val EagleHRISTOPHER Marten Iles, MD 01/24/2015 5:41 PM

## 2015-01-24 NOTE — Progress Notes (Signed)
Patient ID: Tyler Wood, male   DOB: [birth year], 50 y.o.   MRN: [id number] No headache, wound dry. Lumbar catheter working well. Last night and this morning i did speak to him about the need of bed rest and the drainage and why he should stay with us till the weekend. He wants to go home and be inactive no like he was when he went home over the new year. As i mentioned to both of them i can not hold him against his wishes . My advise is to stay longer to allow the duratotomy to heal. His wife will try to convince him to stay

## 2015-01-25 NOTE — Progress Notes (Signed)
Patient ID: Tyler Wood, male   DOB: May 14, 1965, 50 y.o.   MRN: [id number] Accepting our advise about cssend to the lab. No headache. No neck stiffness.

## 2015-01-25 NOTE — Progress Notes (Signed)
0930 drain noted to be disconnected and CSF thoroughly soaked the pt's bed.  Drain was reconnected. No drainage has occurred since this episode.  Botero notified and said to call anesthesia to assess.  Dr. Desmond Lopeurk confirmed patency and said to continue to watch.  He felt since the pt dumped such a large quantity earlier in the bed, it may take a while for CSF to accumulate.

## 2015-01-25 NOTE — Progress Notes (Signed)
Patient didn't not want to wear CPAP at this time. RT will continue to monitor.

## 2015-01-25 NOTE — Progress Notes (Signed)
Day 4 of lumbar spinal drain for dural leak.   Blood pressure 122/83, pulse 104, temperature 36.1 C, temperature source Oral, resp. rate 18, height 6\' 6"  (1.981 m), weight 260 lb (117.935 kg), SpO2 98 %. Per bedside nurse, Lumbar drain became disconnected this morning resulting in unknown amount of CSF leaking onto patient's bed.  Currently not draining CSF. Lumbar drain in place. Site clean dry and intact. Able to aspirate CSF easily through drain.  No leaks visible in catheter.  Will continue until further direction from Dr Jeral FruitBotero.  Arrie AranStephen Turk, MD Anesthesiology

## 2015-01-25 NOTE — Progress Notes (Signed)
Patient stated he didn't want to wear CPAP at this time.  RT will continue to monitor

## 2015-01-26 NOTE — Progress Notes (Signed)
Day 5 of lumbar spinal drain for dural leak.   Blood pressure 124/78, pulse 117,resp. rate 12 height 6\' 6"  (1.981 m), weight 260 lb (117.935 kg), SpO2 99%.  Per bedside nurse, Lumbar drain clamped this morning at request of Dr. Jeral FruitBotero.  Lumbar drain inspected and remains in place. Site clean dry and intact. Patient denies any headache, backache, vision changes, nausea/vomiting.  Will continue until further direction from Dr Jeral FruitBotero.  Per bedside nurse, Dr. Jeral FruitBotero will be by this evening to evaluate patient.  Arrie AranStephen Antwon Rochin, MD Anesthesiology

## 2015-01-26 NOTE — Discharge Summary (Signed)
Physician Discharge Summary  Patient ID: Tyler Wood MRN: [id number] DOB/AGE: [birth year] 50 y.o.  Admit date: 01/22/2015 Discharge date: 01/26/2015  Admission Diagnoses:csf leak  Discharge Diagnoses:  Active Problems:   Dysesthesia   Discharged Condition: stable  Hospital Course:lp drain  Consults anethesisia  Significant Diagnostic Studies: none  Treatments: lp drain  Discharge Exam: Blood pressure 113/75, pulse 111, temperature 98.1 F (36.7 C), temperature source Oral, resp. rate 27, height 6\' 6"  (1.981 Wood), weight 108.4 kg (238 lb 15.7 oz), SpO2 95 %. No headache. Wounds looks dry and flat  Disposition: home attthe care of his wife     Medication List    ASK your doctor about these medications        aspirin 81 MG tablet  Take 81 mg by mouth daily.     cyclobenzaprine 10 MG tablet  Commonly known as:  FLEXERIL  Take 10 mg by mouth 3 (three) times daily as needed for muscle spasms.     diazepam 5 MG tablet  Commonly known as:  VALIUM  Take 1 tablet (5 mg total) by mouth every 6 (six) hours as needed for anxiety.     FISH OIL PO  Take 1 capsule by mouth daily.     gabapentin 600 MG tablet  Commonly known as:  NEURONTIN  Take 600 mg by mouth 3 (three) times daily.     lisinopril 20 MG tablet  Commonly known as:  PRINIVIL,ZESTRIL  Take 20 mg by mouth daily.     multivitamin with minerals tablet  Take 1 tablet by mouth daily.     omeprazole 20 MG capsule  Commonly known as:  PRILOSEC  Take 20 mg by mouth daily.     oxycodone 30 MG immediate release tablet  Commonly known as:  ROXICODONE  Take 30 mg by mouth every 6 (six) hours as needed for pain.         Signed: Karn CassisBOTERO,Tyler Wood 01/26/2015, 6:24 PM

## 2015-01-26 NOTE — Progress Notes (Signed)
Lumbar Drain clamped @ 1140am per Dr. Jeral FruitBotero.  Will continue to monitor pt.

## 2015-01-26 NOTE — Progress Notes (Signed)
Wasted 20cc of Fentanyl pca syringe with Zenovia JordanSydney Sharpe, RN

## 2015-01-27 LAB — CSF CULTURE: CULTURE: NO GROWTH

## 2015-01-27 LAB — CSF CULTURE W GRAM STAIN: Special Requests: NORMAL

## 2015-01-27 NOTE — Progress Notes (Signed)
Day 5 of Spinal drain.  Discussed with Dr. Jeral FruitBotero.  Apparently patient decided to leave hospital AMA.  Dr. Jeral FruitBotero removed the spinal drain, and the patient was discharged.  The sight was clean and dry per report from Dr. Jeral FruitBotero.  Pt was stable overnight.

## 2015-01-27 NOTE — Consult Note (Signed)
NAMGirtha Hake:  Tyler Wood, Tyler Wood                ACCOUNT NO.:  [id number]  MEDICAL RECORD NO.:  [id number]  LOCATION:  3M05C                        FACILITY:  [facility]  PHYSICIAN:  Hilda LiasErnesto Sana Tessmer, M.D.   DATE OF BIRTH:  [birth year]  DATE OF CONSULTATION:  01/26/2015 DATE OF DISCHARGE:  01/26/2015                                CONSULTATION   Tyler Wood, I came to see him early this morning, he was sleeping. Nevertheless, I was helping with surgery, and I was called from the office that he was calling from his cellphone to my Diplomatic Services operational officersecretary.  I came to see him, and essentially he does not want any more treatment.  He feels that he is ready to go.  He is sick and tired of being bedrest, and he wants the catheter out, and he wanted to go home as soon as possible.  I was talking to him and nurse, and we tried to convince him about the need to be at least with the catheter for at least 24 to 48 hours.  I told him that we do not have a special setup and see how long the patient can be in bedrest, but for us somebody had leaked initially, we like to keep him at least 5 days now.  Nevertheless, the patient tells me that he does not want anymore catheter, he wants to go home. Then when I finished having surgery, I came around 6 o'clock.  His wife was there.  Of course, she wants him to stay but the patient wants to go home.  There is no way I can hold him against his wishes.  The catheter was removed.  I applied a dressing to that area.  We are going to go ahead with the elevation of his head up to 45 minutes just to be sure that he does not faint, then he will be out of bed and go home at the care of his wife tonight.  He has plenty of pain medications at home as well as muscle relaxants.  As I mentioned to them, I hope that things turn out really well, but I would not be surprised if he called later on with the same CSF leak and headache.  If that is the situation then we had to be facing about opening him up,  pulling our catheter, or using a lumbar peritoneal diversion.  The patient is fully awake.  He is able to make his decision, and he understands about what is going on.          ______________________________ Hilda LiasErnesto Khadim Lundberg, M.D.     EB/MEDQ  D:  01/26/2015  T:  01/27/2015  Job:  [id number]

## 2015-03-01 ENCOUNTER — Encounter: Payer: Self-pay | Admitting: Gastroenterology

## 2015-07-10 ENCOUNTER — Other Ambulatory Visit: Payer: Self-pay | Admitting: Anesthesiology

## 2015-07-18 NOTE — Pre-Procedure Instructions (Signed)
Rosezena Sensoratrick S Fontenot  07/18/2015     Your procedure is scheduled on : Friday July 20, 2015 at 9:45 AM.  Report to [facility] Admitting at 7:45 AM.  Call this number if you have problems the morning of surgery: [phone]    Remember:  Do not eat food or drink liquids after midnight.  Take these medicines the morning of surgery with A SIP OF WATER : Diazepam (Valium) if needed, Lyrica, Omeprazole (Prilosec), Oxycodone if needed   Stop taking any vitamins, herbal medications/supplements, NSAIDs, Ibuprofen, Advil, Motrin, Aleve, Omega 3/Fish oil, etc today   Do NOT take any diabetic pills the morning of your surgery (NO Glimepiride/Amaryl; NO Metformin/Glucophage)   How to Manage Your Diabetes Before and After Surgery  Why is it important to control my blood sugar before and after surgery? . Improving blood sugar levels before and after surgery helps healing and can limit problems. . A way of improving blood sugar control is eating a healthy diet by: o  Eating less sugar and carbohydrates o  Increasing activity/exercise o  Talking with your doctor about reaching your blood sugar goals . High blood sugars (greater than 180 mg/dL) can raise your risk of infections and slow your recovery, so you will need to focus on controlling your diabetes during the weeks before surgery. . Make sure that the doctor who takes care of your diabetes knows about your planned surgery including the date and location.  How do I manage my blood sugar before surgery? . Check your blood sugar at least 4 times a day, starting 2 days before surgery, to make sure that the level is not too high or low. o Check your blood sugar the morning of your surgery when you wake up and every 2 hours until you get to the Short Stay unit. . If your blood sugar is less than 70 mg/dL, you will need to treat for low blood sugar: o Do not take insulin. o Treat a low blood sugar (less than 70 mg/dL) with  cup of clear juice  (cranberry or apple), 4 glucose tablets, OR glucose gel. o Recheck blood sugar in 15 minutes after treatment (to make sure it is greater than 70 mg/dL). If your blood sugar is not greater than 70 mg/dL on recheck, call [phone] for further instructions. . Report your blood sugar to the short stay nurse when you get to Short Stay.  . If you are admitted to the hospital after surgery: o Your blood sugar will be checked by the staff and you will probably be given insulin after surgery (instead of oral diabetes medicines) to make sure you have good blood sugar levels. o The goal for blood sugar control after surgery is 80-180 mg/dL.     WHAT DO I DO ABOUT MY DIABETES MEDICATION?   Marland Kitchen. Do not take oral diabetes medicines (pills) the morning of surgery.  Reviewed and Endorsed by [facility] Patient Education Committee, August 2015   Do not wear jewelry.  Do not wear lotions, powders, or cologne.    Men may shave face and neck.  Do not bring valuables to the hospital.  [facility] is not responsible for any belongings or valuables.  Contacts, dentures or bridgework may not be worn into surgery.  Leave your suitcase in the car.  After surgery it may be brought to your room.  For patients admitted to the hospital, discharge time will be determined by your treatment team.  Patients discharged the  day of surgery will not be allowed to drive home.   Name and phone number of your driver:    Special instructions:  Shower using CHG soap the night before and the morning of your surgery  Please read over the following fact sheets that you were given.

## 2015-07-19 ENCOUNTER — Other Ambulatory Visit (HOSPITAL_COMMUNITY): Payer: Self-pay | Admitting: *Deleted

## 2015-07-19 ENCOUNTER — Encounter (HOSPITAL_COMMUNITY): Payer: Self-pay

## 2015-07-19 ENCOUNTER — Encounter (HOSPITAL_COMMUNITY)
Admission: RE | Admit: 2015-07-19 | Discharge: 2015-07-19 | Disposition: A | Discharge status: Home / Self Care | Payer: BLUE CROSS/BLUE SHIELD | Source: Ambulatory Visit | Attending: Anesthesiology | Admitting: Anesthesiology

## 2015-07-19 ENCOUNTER — Other Ambulatory Visit: Payer: Self-pay

## 2015-07-19 DIAGNOSIS — Z7984 Long term (current) use of oral hypoglycemic drugs: Secondary | ICD-10-CM | POA: Diagnosis not present

## 2015-07-19 DIAGNOSIS — Z7982 Long term (current) use of aspirin: Secondary | ICD-10-CM | POA: Diagnosis not present

## 2015-07-19 DIAGNOSIS — I10 Essential (primary) hypertension: Secondary | ICD-10-CM | POA: Diagnosis not present

## 2015-07-19 DIAGNOSIS — M1990 Unspecified osteoarthritis, unspecified site: Secondary | ICD-10-CM | POA: Diagnosis not present

## 2015-07-19 DIAGNOSIS — M961 Postlaminectomy syndrome, not elsewhere classified: Secondary | ICD-10-CM | POA: Diagnosis not present

## 2015-07-19 DIAGNOSIS — K219 Gastro-esophageal reflux disease without esophagitis: Secondary | ICD-10-CM | POA: Diagnosis not present

## 2015-07-19 DIAGNOSIS — G8929 Other chronic pain: Secondary | ICD-10-CM | POA: Diagnosis present

## 2015-07-19 DIAGNOSIS — G894 Chronic pain syndrome: Secondary | ICD-10-CM | POA: Diagnosis not present

## 2015-07-19 DIAGNOSIS — M5116 Intervertebral disc disorders with radiculopathy, lumbar region: Secondary | ICD-10-CM | POA: Diagnosis not present

## 2015-07-19 DIAGNOSIS — G4730 Sleep apnea, unspecified: Secondary | ICD-10-CM | POA: Diagnosis not present

## 2015-07-19 DIAGNOSIS — F17200 Nicotine dependence, unspecified, uncomplicated: Secondary | ICD-10-CM | POA: Diagnosis not present

## 2015-07-19 HISTORY — DX: Type 2 diabetes mellitus without complications: E11.9

## 2015-07-19 LAB — BASIC METABOLIC PANEL
Anion gap: 7 (ref 5–15)
BUN: 10 mg/dL (ref 6–20)
CALCIUM: 9.1 mg/dL (ref 8.9–10.3)
CO2: 28 mmol/L (ref 22–32)
Chloride: 102 mmol/L (ref 101–111)
Creatinine, Ser: 0.97 mg/dL (ref 0.61–1.24)
GFR calc Af Amer: >60 mL/min (ref >60)
GLUCOSE: 88 mg/dL (ref 65–99)
Potassium: 4.1 mmol/L (ref 3.5–5.1)
Sodium: 137 mmol/L (ref 135–145)

## 2015-07-19 LAB — CBC
HCT: 41.5 % (ref 39.0–52.0)
Hemoglobin: 13.9 g/dL (ref 13.0–17.0)
MCH: 31.5 pg (ref 26.0–34.0)
MCHC: 33.5 g/dL (ref 30.0–36.0)
MCV: 94.1 fL (ref 78.0–100.0)
Platelets: 183 10*3/uL (ref 150–400)
RBC: 4.41 MIL/uL (ref 4.22–5.81)
RDW: 14.2 % (ref 11.5–15.5)
WBC: 10.7 10*3/uL — ABNORMAL HIGH (ref 4.0–10.5)

## 2015-07-19 LAB — SURGICAL PCR SCREEN
MRSA, PCR: NEGATIVE
Staphylococcus aureus: NEGATIVE

## 2015-07-19 LAB — GLUCOSE, CAPILLARY: Glucose-Capillary: 113 mg/dL — ABNORMAL HIGH (ref 65–99)

## 2015-07-19 MED ORDER — DEXTROSE 5 % IV SOLN
3.0000 g | INTRAVENOUS | Status: AC
  Administered 2015-07-20: 3 g via INTRAVENOUS
  Filled 2015-07-19: qty 3000

## 2015-07-19 NOTE — H&P (Signed)
Tyler Wood is an 50 y.o. male.   Chief Complaint: back pain with radiculopathy HPI: Patient with last surgery January 12, 2015 for decompression of the L5-S1 nerve root.  He has already had a fusion from L3 to S1.  Unfortunately the patient has had continued pain since then despite very high doses of medication.  He is utilizing Roxicodone 30 mg every 4 hours and reporting only minimal improvement in his symptoms.  He also utilizes Valium.  He was previously on gabapentin but was having some significant lower extremity swelling and had to stop the medication.  He is continuing to have some pitting edema in both lower extremities.  It seems to be worse on the right than left. Repeat imaging was overall unremarkable and other pain control modalities were considered.  Pt underwent SCS trial after having been found to be a good candidate and having all of his questions answered.  He returned, stating that the spinal stimulator provided at least a 50% improvement in symptoms.  While he was not able to reduce his medication usage at all he does feel that he was able to walk somewhat better.  He turned the stimulator off for a couple of hours prior to this appointment and states that his legs immediately began to feel weak.  He had not noticed how much improvement he received until he turned the stimulator off.  He seemed to do quite well with high-frequency programming.  However the patient is also interested in trialing Lyrica once more.  He reports no new symptoms with the spinal cord stimulator.  He reports no areas that were not covered. His wife also states that she feels that she noticed improvement in his mobility and states that he complained of pain much less.   Past Medical History  Diagnosis Date  . Hypertension   . Arthritis     back  . GERD (gastroesophageal reflux disease)   . Sleep apnea     Uses CPAP  . Diabetes mellitus without complication [facility])     Past Surgical History  Procedure  Laterality Date  . Back surgery      Fused on Feb. 17, 2016  . Foraminotomy 1 level  01/12/2015    L5 S1  . Lumbar laminectomy/decompression microdiscectomy Right 01/12/2015    Procedure: right Lumbar five -sacral one Foraminotomy;  Surgeon: Leeroy Cha, MD;  Location: [facility];  Service: Neurosurgery;  Laterality: Right;    No family history on file. Social History:  reports that he has been smoking.  He has never used smokeless tobacco. He reports that he drinks about 1.2 oz of alcohol per week. He reports that he does not use illicit drugs.  Allergies: No Known Allergies  Medications Prior to Admission  Medication Sig Dispense Refill  . aspirin 81 MG tablet Take 81 mg by mouth daily.    . diazepam (VALIUM) 10 MG tablet Take 10 mg by mouth every 8 (eight) hours as needed (for muscle spasms).   2  . furosemide (LASIX) 20 MG tablet Take 20 mg by mouth daily.  0  . glimepiride (AMARYL) 2 MG tablet Take 2 mg by mouth every morning.  5  . lisinopril-hydrochlorothiazide (PRINZIDE,ZESTORETIC) 20-12.5 MG tablet Take 1 tablet by mouth daily.  4  . LYRICA 150 MG capsule Take 150 mg by mouth 3 (three) times daily.  1  . metFORMIN (GLUCOPHAGE) 500 MG tablet Take 500 mg by mouth 2 (two) times daily.  2  .  Multiple Vitamins-Minerals (MULTIVITAMIN WITH MINERALS) tablet Take 1 tablet by mouth daily.    . mupirocin ointment (BACTROBAN) 2 % Apply 1 application topically daily as needed (for wound healing).   1  . Omega-3 Fatty Acids (FISH OIL PO) Take 1 capsule by mouth daily.    . Omega-3 Fatty Acids (FISH OIL) 1000 MG CAPS Take 1,000 mg by mouth daily.    Marland Kitchen omeprazole (PRILOSEC) 20 MG capsule Take 20 mg by mouth daily.    Marland Kitchen oxycodone (ROXICODONE) 30 MG immediate release tablet Take 30 mg by mouth every 4 (four) hours as needed for pain.       Results for orders placed or performed during the hospital encounter of 07/19/15 (from the past 48 hour(s))  Glucose, capillary     Status: Abnormal    Collection Time: 07/19/15  8:07 AM  Result Value Ref Range   Glucose-Capillary 113 (H) 65 - 99 mg/dL  Basic metabolic panel     Status: None   Collection Time: 07/19/15  8:27 AM  Result Value Ref Range   Sodium 137 135 - 145 mmol/L   Potassium 4.1 3.5 - 5.1 mmol/L   Chloride 102 101 - 111 mmol/L   CO2 28 22 - 32 mmol/L   Glucose, Bld 88 65 - 99 mg/dL   BUN 10 6 - 20 mg/dL   Creatinine, Ser 0.97 0.61 - 1.24 mg/dL   Calcium 9.1 8.9 - 10.3 mg/dL   GFR calc non Af Amer >60 >60 mL/min   GFR calc Af Amer >60 >60 mL/min    Comment: (NOTE) The eGFR has been calculated using the CKD EPI equation. This calculation has not been validated in all clinical situations. eGFR's persistently <60 mL/min signify possible Chronic Kidney Disease.    Anion gap 7 5 - 15  CBC     Status: Abnormal   Collection Time: 07/19/15  8:27 AM  Result Value Ref Range   WBC 10.7 (H) 4.0 - 10.5 K/uL   RBC 4.41 4.22 - 5.81 MIL/uL   Hemoglobin 13.9 13.0 - 17.0 g/dL   HCT 41.5 39.0 - 52.0 %   MCV 94.1 78.0 - 100.0 fL   MCH 31.5 26.0 - 34.0 pg   MCHC 33.5 30.0 - 36.0 g/dL   RDW 14.2 11.5 - 15.5 %   Platelets 183 150 - 400 K/uL  Surgical pcr screen     Status: None   Collection Time: 07/19/15  8:27 AM  Result Value Ref Range   MRSA, PCR NEGATIVE NEGATIVE   Staphylococcus aureus NEGATIVE NEGATIVE    Comment:        The Xpert SA Assay (FDA approved for NASAL specimens in patients over 41 years of age), is one component of a comprehensive surveillance program.  Test performance has been validated by [facility] for patients greater than or equal to 60 year old. It is not intended to diagnose infection nor to guide or monitor treatment.    No results found.  Review of Systems  Constitutional: Negative.   Eyes: Negative.   Respiratory: Negative.   Cardiovascular: Negative.   Gastrointestinal: Negative.   Genitourinary: Negative.   Musculoskeletal: Positive for back pain. Negative for myalgias and  falls.  Skin: Negative.   Neurological: Negative.   Endo/Heme/Allergies: Negative.   Psychiatric/Behavioral: Negative.     Blood pressure 119/86, pulse 88, temperature 98.3 F (36.8 C), temperature source Oral, resp. rate 16, SpO2 96 %. Physical Exam  Constitutional: He is oriented to person,  place, and time. He appears well-developed and well-nourished.  HENT:  Head: Normocephalic and atraumatic.  Eyes: Conjunctivae are normal. Pupils are equal, round, and reactive to light.  Neck: Normal range of motion.  Cardiovascular: Normal rate and regular rhythm.   Musculoskeletal: Normal range of motion.  Neurological: He is alert and oriented to person, place, and time.  Skin: Skin is warm and dry.  Psychiatric: He has a normal mood and affect. His behavior is normal. Judgment and thought content normal.     Assessment/Plan A) chronic pain syndrome; lumbar post-laminectomy syndrome  Bonna Gains, MD 07/20/2015, 10:04 AM

## 2015-07-19 NOTE — Progress Notes (Signed)
Denies having cardiac history, cardiologist or testing; PCP Dr  Paulino DoorMark Weiser  EKG today.  Denies SOB, cough or congestion.

## 2015-07-20 ENCOUNTER — Encounter (HOSPITAL_COMMUNITY): Payer: Self-pay | Admitting: *Deleted

## 2015-07-20 ENCOUNTER — Ambulatory Visit (HOSPITAL_COMMUNITY)
Admission: RE | Admit: 2015-07-20 | Discharge: 2015-07-20 | Disposition: A | Discharge status: Home / Self Care | Payer: BLUE CROSS/BLUE SHIELD | Source: Ambulatory Visit | Attending: Anesthesiology | Admitting: Anesthesiology

## 2015-07-20 ENCOUNTER — Ambulatory Visit (HOSPITAL_COMMUNITY): Payer: BLUE CROSS/BLUE SHIELD | Admitting: Certified Registered Nurse Anesthetist

## 2015-07-20 ENCOUNTER — Encounter (HOSPITAL_COMMUNITY)
Admission: RE | Disposition: A | Discharge status: Home / Self Care | Payer: Self-pay | Source: Ambulatory Visit | Attending: Anesthesiology

## 2015-07-20 ENCOUNTER — Ambulatory Visit (HOSPITAL_COMMUNITY): Payer: BLUE CROSS/BLUE SHIELD

## 2015-07-20 DIAGNOSIS — M1990 Unspecified osteoarthritis, unspecified site: Secondary | ICD-10-CM | POA: Diagnosis not present

## 2015-07-20 DIAGNOSIS — K219 Gastro-esophageal reflux disease without esophagitis: Secondary | ICD-10-CM | POA: Diagnosis not present

## 2015-07-20 DIAGNOSIS — I10 Essential (primary) hypertension: Secondary | ICD-10-CM | POA: Insufficient documentation

## 2015-07-20 DIAGNOSIS — G4730 Sleep apnea, unspecified: Secondary | ICD-10-CM | POA: Diagnosis not present

## 2015-07-20 DIAGNOSIS — Z7984 Long term (current) use of oral hypoglycemic drugs: Secondary | ICD-10-CM | POA: Insufficient documentation

## 2015-07-20 DIAGNOSIS — M5116 Intervertebral disc disorders with radiculopathy, lumbar region: Secondary | ICD-10-CM | POA: Insufficient documentation

## 2015-07-20 DIAGNOSIS — Z7982 Long term (current) use of aspirin: Secondary | ICD-10-CM | POA: Diagnosis not present

## 2015-07-20 DIAGNOSIS — M961 Postlaminectomy syndrome, not elsewhere classified: Secondary | ICD-10-CM | POA: Insufficient documentation

## 2015-07-20 DIAGNOSIS — F17200 Nicotine dependence, unspecified, uncomplicated: Secondary | ICD-10-CM | POA: Insufficient documentation

## 2015-07-20 DIAGNOSIS — Z419 Encounter for procedure for purposes other than remedying health state, unspecified: Secondary | ICD-10-CM

## 2015-07-20 DIAGNOSIS — G894 Chronic pain syndrome: Secondary | ICD-10-CM | POA: Insufficient documentation

## 2015-07-20 DIAGNOSIS — G8929 Other chronic pain: Secondary | ICD-10-CM | POA: Diagnosis present

## 2015-07-20 HISTORY — PX: SPINAL CORD STIMULATOR INSERTION: SHX5378

## 2015-07-20 LAB — GLUCOSE, CAPILLARY
Glucose-Capillary: 98 mg/dL (ref 65–99)
Glucose-Capillary: 91 mg/dL (ref 65–99)

## 2015-07-20 LAB — HEMOGLOBIN A1C
Hgb A1c MFr Bld: 5.5 % (ref 4.8–5.6)
Mean Plasma Glucose: 111 mg/dL

## 2015-07-20 SURGERY — INSERTION, SPINAL CORD STIMULATOR, LUMBAR
Anesthesia: Monitor Anesthesia Care | Site: Spine Lumbar

## 2015-07-20 MED ORDER — CEPHALEXIN 500 MG PO CAPS: 500.0000 mg | ORAL_CAPSULE | Freq: Three times a day (TID) | ORAL | Status: DC

## 2015-07-20 MED ORDER — FENTANYL CITRATE (PF) 250 MCG/5ML IJ SOLN
INTRAMUSCULAR | Status: AC
  Filled 2015-07-20: qty 5

## 2015-07-20 MED ORDER — CHLORHEXIDINE GLUCONATE CLOTH 2 % EX PADS: 6.0000 | MEDICATED_PAD | Freq: Once | CUTANEOUS | Status: DC

## 2015-07-20 MED ORDER — MEPERIDINE HCL 25 MG/ML IJ SOLN: 6.2500 mg | INTRAMUSCULAR | Status: DC | PRN

## 2015-07-20 MED ORDER — PROPOFOL 10 MG/ML IV BOLUS
INTRAVENOUS | Status: DC | PRN
  Administered 2015-07-20: 10 mg via INTRAVENOUS
  Administered 2015-07-20 (×3): 20 mg via INTRAVENOUS

## 2015-07-20 MED ORDER — LACTATED RINGERS IV SOLN: INTRAVENOUS | Status: DC

## 2015-07-20 MED ORDER — FENTANYL CITRATE (PF) 100 MCG/2ML IJ SOLN: 25.0000 ug | INTRAMUSCULAR | Status: DC | PRN

## 2015-07-20 MED ORDER — 0.9 % SODIUM CHLORIDE (POUR BTL) OPTIME
TOPICAL | Status: DC | PRN
  Administered 2015-07-20: 1000 mL

## 2015-07-20 MED ORDER — SODIUM CHLORIDE 0.9 % IR SOLN
Status: DC | PRN
  Administered 2015-07-20: 500 mL

## 2015-07-20 MED ORDER — KETAMINE HCL 100 MG/ML IJ SOLN
INTRAMUSCULAR | Status: AC
  Administered 2015-07-20 (×10): 10 mg via INTRAVENOUS
  Filled 2015-07-20: qty 1

## 2015-07-20 MED ORDER — PROPOFOL 500 MG/50ML IV EMUL
INTRAVENOUS | Status: DC | PRN
  Administered 2015-07-20: 80 ug/kg/min via INTRAVENOUS

## 2015-07-20 MED ORDER — METOCLOPRAMIDE HCL 5 MG/ML IJ SOLN: 10.0000 mg | Freq: Once | INTRAMUSCULAR | Status: DC | PRN

## 2015-07-20 MED ORDER — MIDAZOLAM HCL 2 MG/2ML IJ SOLN
INTRAMUSCULAR | Status: AC
  Filled 2015-07-20: qty 2

## 2015-07-20 MED ORDER — BUPIVACAINE-EPINEPHRINE (PF) 0.5% -1:200000 IJ SOLN
INTRAMUSCULAR | Status: DC | PRN
  Administered 2015-07-20: 30 mL
  Administered 2015-07-20: 17 mL

## 2015-07-20 MED ORDER — LACTATED RINGERS IV SOLN
INTRAVENOUS | Status: DC
  Administered 2015-07-20 (×2): via INTRAVENOUS

## 2015-07-20 MED ORDER — BACITRACIN-NEOMYCIN-POLYMYXIN OINTMENT TUBE
TOPICAL_OINTMENT | CUTANEOUS | Status: DC | PRN
  Administered 2015-07-20: 1 via TOPICAL

## 2015-07-20 MED ORDER — FENTANYL CITRATE (PF) 100 MCG/2ML IJ SOLN
INTRAMUSCULAR | Status: DC | PRN
  Administered 2015-07-20: 50 ug via INTRAVENOUS

## 2015-07-20 MED ORDER — MIDAZOLAM HCL 5 MG/5ML IJ SOLN
INTRAMUSCULAR | Status: DC | PRN
  Administered 2015-07-20: 2 mg via INTRAVENOUS

## 2015-07-20 SURGICAL SUPPLY — 67 items
ANCH LD 4 SETX2 CLIK X (Stimulator) ×1 IMPLANT
ANCHOR CLIK X NEURO (Stimulator) ×2 IMPLANT
APL SKNCLS STERI-STRIP NONHPOA (GAUZE/BANDAGES/DRESSINGS)
BAG DECANTER FOR FLEXI CONT (MISCELLANEOUS) ×2 IMPLANT
BENZOIN TINCTURE PRP APPL 2/3 (GAUZE/BANDAGES/DRESSINGS) IMPLANT
BINDER ABDOMINAL 12 ML 46-62 (SOFTGOODS) ×2 IMPLANT
BLADE CLIPPER SURG (BLADE) IMPLANT
CABLE OR STIMULATOR 2X8 61 (WIRE) ×4 IMPLANT
CHLORAPREP W/TINT 26ML (MISCELLANEOUS) ×2 IMPLANT
CLIP TI WIDE RED SMALL 6 (CLIP) IMPLANT
DRAPE C-ARM 42X72 X-RAY (DRAPES) ×2 IMPLANT
DRAPE C-ARMOR (DRAPES) ×2 IMPLANT
DRAPE LAPAROTOMY 100X72X124 (DRAPES) ×2 IMPLANT
DRAPE POUCH INSTRU U-SHP 10X18 (DRAPES) ×2 IMPLANT
DRAPE SURG 17X23 STRL (DRAPES) IMPLANT
DRSG OPSITE POSTOP 3X4 (GAUZE/BANDAGES/DRESSINGS) ×2 IMPLANT
DRSG OPSITE POSTOP 4X6 (GAUZE/BANDAGES/DRESSINGS) ×2 IMPLANT
ELECT REM PT RETURN 9FT ADLT (ELECTROSURGICAL) ×2
ELECTRODE REM PT RTRN 9FT ADLT (ELECTROSURGICAL) ×1 IMPLANT
GAUZE SPONGE 4X4 16PLY XRAY LF (GAUZE/BANDAGES/DRESSINGS) IMPLANT
GLOVE BIOGEL PI IND STRL 7.0 (GLOVE) ×2 IMPLANT
GLOVE BIOGEL PI IND STRL 7.5 (GLOVE) ×1 IMPLANT
GLOVE BIOGEL PI INDICATOR 7.0 (GLOVE) ×2
GLOVE BIOGEL PI INDICATOR 7.5 (GLOVE) ×1
GLOVE ECLIPSE 7.5 STRL STRAW (GLOVE) ×2 IMPLANT
GLOVE EXAM NITRILE LRG STRL (GLOVE) IMPLANT
GLOVE EXAM NITRILE MD LF STRL (GLOVE) IMPLANT
GLOVE EXAM NITRILE XL STR (GLOVE) IMPLANT
GLOVE EXAM NITRILE XS STR PU (GLOVE) IMPLANT
GLOVE SURG SS PI 6.5 STRL IVOR (GLOVE) ×6 IMPLANT
GOWN STRL REUS W/ TWL LRG LVL3 (GOWN DISPOSABLE) ×3 IMPLANT
GOWN STRL REUS W/ TWL XL LVL3 (GOWN DISPOSABLE) IMPLANT
GOWN STRL REUS W/TWL 2XL LVL3 (GOWN DISPOSABLE) IMPLANT
GOWN STRL REUS W/TWL LRG LVL3 (GOWN DISPOSABLE) ×6
GOWN STRL REUS W/TWL XL LVL3 (GOWN DISPOSABLE)
IPG PRECISION SPECTRA (Stimulator) ×2 IMPLANT
KIT BASIN OR (CUSTOM PROCEDURE TRAY) ×2 IMPLANT
KIT CHARGING (KITS) ×1
KIT CHARGING PRECISION NEURO (KITS) ×1 IMPLANT
KIT PAT PROGRAM FREELINK (KITS) ×1 IMPLANT
KIT ROOM TURNOVER OR (KITS) ×2 IMPLANT
LEAD INFINION CX PERC 70CM (Cable) ×4 IMPLANT
LIQUID BAND (GAUZE/BANDAGES/DRESSINGS) IMPLANT
NEEDLE 18GX1X1/2 (RX/OR ONLY) (NEEDLE) IMPLANT
NEEDLE ENTRADA 4.5IN (NEEDLE) ×4 IMPLANT
NEEDLE HYPO 25X1 1.5 SAFETY (NEEDLE) ×2 IMPLANT
NS IRRIG 1000ML POUR BTL (IV SOLUTION) ×2 IMPLANT
PACK LAMINECTOMY NEURO (CUSTOM PROCEDURE TRAY) ×2 IMPLANT
PAD ARMBOARD 7.5X6 YLW CONV (MISCELLANEOUS) ×10 IMPLANT
REMOTE CONTROL KIT (KITS) ×2
SPONGE LAP 4X18 X RAY DECT (DISPOSABLE) IMPLANT
SPONGE SURGIFOAM ABS GEL SZ50 (HEMOSTASIS) IMPLANT
STAPLER SKIN PROX WIDE 3.9 (STAPLE) ×2 IMPLANT
STRIP CLOSURE SKIN 1/2X4 (GAUZE/BANDAGES/DRESSINGS) IMPLANT
SUT MNCRL AB 4-0 PS2 18 (SUTURE) ×2 IMPLANT
SUT SILK 0 (SUTURE) ×2
SUT SILK 0 MO-6 18XCR BRD 8 (SUTURE) ×1 IMPLANT
SUT SILK 0 TIES 10X30 (SUTURE) IMPLANT
SUT SILK 2 0 TIES 10X30 (SUTURE) IMPLANT
SUT VIC AB 2-0 CP2 18 (SUTURE) ×6 IMPLANT
SYR EPIDURAL 5ML GLASS (SYRINGE) ×2 IMPLANT
SYRINGE 10CC LL (SYRINGE) IMPLANT
TOOL LONG TUNNEL (SPINAL CORD STIMULATOR) ×2 IMPLANT
TOWEL OR 17X24 6PK STRL BLUE (TOWEL DISPOSABLE) ×2 IMPLANT
TOWEL OR 17X26 10 PK STRL BLUE (TOWEL DISPOSABLE) ×2 IMPLANT
WATER STERILE IRR 1000ML POUR (IV SOLUTION) ×2 IMPLANT
YANKAUER SUCT BULB TIP NO VENT (SUCTIONS) ×2 IMPLANT

## 2015-07-20 NOTE — Anesthesia Postprocedure Evaluation (Signed)
Anesthesia Post Note  Patient: Salley Slaughteratrick S Otterson  Procedure(s) Performed: Procedure(s) (LRB): LUMBAR SPINAL CORD STIMULATOR INSERTION (N/A)  Patient location during evaluation: PACU Anesthesia Type: MAC Level of consciousness: awake and alert Pain management: pain level controlled Vital Signs Assessment: post-procedure vital signs reviewed and stable Respiratory status: spontaneous breathing, nonlabored ventilation and respiratory function stable Cardiovascular status: stable and blood pressure returned to baseline Anesthetic complications: no    Last Vitals:  Filed Vitals:   07/20/15 1153 07/20/15 1204  BP: 133/93 137/78  Pulse: 96 91  Temp:  36.4 C  Resp: 17 20    Last Pain:  Filed Vitals:   07/20/15 1206  PainSc: 7                  Phillips Groutarignan, Helaina Stefano

## 2015-07-20 NOTE — Op Note (Signed)
PREOP DX: 1) lumbago  2) lumbar radiculopathy  3) lumbar post-laminectomy syndrome  4) chronic pain  POSTOP DX: 1) lumbago  2) lumbar radiculopathy  3) lumbar post-laminectomy syndrome  4) chronic pain PROCEDURES PERFORMED:1) intraop fluoro 2) placement of 2 16 contact [employer] Infinion leads 3) placement of Spectra SCS generator  SURGEON:Rie Mcneil  ASSISTANT: NONE  ANESTHESIA: MAC  EBL: <20cc  DESCRIPTION OF PROCEDURE: After a discussion of risks, benefits and alternatives, informed consent was obtained. The patient was taken to the OR, turned prone onto a Jackson table, all pressure points padded, SCD's placed, and an adequate plane of anesthesia induced. A timeout was taken to verify the correct patient, position, personnel, availability of appropriate equipment, and administration of perioperative antibiotics.  The thoracic and lumbar areas were widely prepped with chloraprep and draped into a sterile field. Fluoroscopy was used to plan a left paramedian incision at the T12-L2 levels, and an incision made with a 10 blade and carried down to the dorsolumbar fascia with the bovie and blunt dissection. Retractors were placed and a 14g [employer] tuohy needle placed into the epidural space at the T12-L1 interspace using biplanar fluoro and loss-of-resistance technique. The needle was aspirated without any return of fluid. A [employer] INFINION lead was introduced and under live AP fluoro advanced until the distal-most contact overlay the inferior aspect T6 vertebral body shadow with the rest of the contacts distributed over the T7 and T8 vertebral bodies in a position just right of anatomic midline. A second Infinion lead was placed just left of anatomic midline in the same levels using the same technique. The patient was awakened and the leads  tested; impedances were good, and the patient reported good coverage with amplitudes in the 5-7 mA range. 0 silk sutures were placed in the fascia adjacent to the needles. The needles and stylets were removed under fluoroscopy with no lead migration noted. Leads were then fixed to the fascia with clik-anchors fixation; repeat images were obtained to verify that there had been no lead migration.   Attention was then turned to creation of a subcutaneous pocket. At the left flank/buttock, a 3 cm incision was made with a 10 blade and using the bovie and blunt dissection a pocket of size appropriate to place a SCS generator. The pocket was trialed, and found to be of adequate size. The pocket was inspected for hemostasis, which was found to be excellent. Using reverse seldinger technique, the leads were tunneled to the pocket site, and the leads inserted into the SCS generator. Impedances were checked, and all found to be excellent. The leads were then all fixed into position with a self-torquing wrench. The wiring was all carefully coiled, placed behind the generator and placed in the pocket.  Both incisions were copiously irrigated with bacitracin-containing irrigation. The lumbar incision was closed in 2 deep layers of interrupted 2-0 vicryl and the skin closed with staples. The pocket incision was closed with a deeper layer of 2-0 vicryl interrupted sutures, and the skin closed with staples. Sterile dressings were applied. Needle, sponge, and instrument counts were correct x2 at the end of the case.  The patient was then carefully awakened from anesthesia, turned supine, an abdominal binder placed, and the patient taken to the recovery room where he underwent complex spinal cord stimulator programming.  COMPLICATIONS: NONE  CONDITION: Stable throughout the course of the procedure and immediately afterward  DISPOSITION: discharge to home, with antibiotics and pain medicine. Discussed care with the  patient  and spouse. Followup in clinic will be scheduled in 10-14 days.

## 2015-07-20 NOTE — Anesthesia Preprocedure Evaluation (Signed)
Anesthesia Evaluation  Patient identified by MRN, date of birth, ID band Patient awake    Reviewed: Allergy & Precautions, NPO status , Patient's Chart, lab work & pertinent test results  Airway Mallampati: II  TM Distance: >3 FB Neck ROM: Full    Dental no notable dental hx. (+) Teeth Intact   Pulmonary sleep apnea and Continuous Positive Airway Pressure Ventilation , Current Smoker,    breath sounds clear to auscultation       Cardiovascular hypertension,  Rhythm:Regular Rate:Normal     Neuro/Psych negative neurological ROS  negative psych ROS   GI/Hepatic Neg liver ROS, GERD  Medicated and Controlled,  Endo/Other  negative endocrine ROSdiabetes, Poorly Controlled, Type 2  Renal/GU negative Renal ROS  negative genitourinary   Musculoskeletal  (+) Arthritis , Osteoarthritis,  DDD lumbar spine   Abdominal   Peds  Hematology negative hematology ROS (+)   Anesthesia Other Findings   Reproductive/Obstetrics                             Anesthesia Physical  Anesthesia Plan  ASA: III  Anesthesia Plan: MAC   Post-op Pain Management:    Induction:   Airway Management Planned: Simple Face Mask  Additional Equipment:   Intra-op Plan:   Post-operative Plan:   Informed Consent: I have reviewed the patients History and Physical, chart, labs and discussed the procedure including the risks, benefits and alternatives for the proposed anesthesia with the patient or authorized representative who has indicated his/her understanding and acceptance.   Dental advisory given  Plan Discussed with: CRNA, Anesthesiologist and Surgeon  Anesthesia Plan Comments:         Anesthesia Quick Evaluation

## 2015-07-20 NOTE — Anesthesia Procedure Notes (Signed)
Procedure Name: MAC Performed by: YACOUB, Sahid Borba B Pre-anesthesia Checklist: Patient identified, Emergency Drugs available, Suction available, Patient being monitored and Timeout performed Patient Re-evaluated:Patient Re-evaluated prior to inductionOxygen Delivery Method: Simple face mask Preoxygenation: Pre-oxygenation with 100% oxygen Intubation Type: IV induction Placement Confirmation: positive ETCO2 Dental Injury: Teeth and Oropharynx as per pre-operative assessment      

## 2015-07-20 NOTE — Discharge Instructions (Signed)
Dr. Jamaury Gumz Post-Op Orders ° °• Ice Pack - 20 minutes on (in a pillow case), and 20 minutes off. Wear the ice pack UNDER the binder. °• Follow up in office, they will call you for an appointment in 10 days to 2 weeks. °• Increase activity gradually.   °• No lifting anything heavier than a gallon of milk (10 pounds) until seen in the office. °• Advance diet slowly as tolerated. °• Dressing care:  Keep dressing dry for 3 days, and on Post-op day 4, may shower. °• Call for fever, drainage, and redness. °• No swimming or bathing in a bathtub (do not get into standing water). °•  °

## 2015-07-20 NOTE — Transfer of Care (Signed)
Immediate Anesthesia Transfer of Care Note  Patient: Tyler Wood  Procedure(s) Performed: Procedure(s) with comments: LUMBAR SPINAL CORD STIMULATOR INSERTION (N/A) - Lumbar/thoracic spine  Patient Location: PACU  Anesthesia Type:MAC  Level of Consciousness: awake, alert  and oriented  Airway & Oxygen Therapy: Patient Spontanous Breathing  Post-op Assessment: Report given to RN and Post -op Vital signs reviewed and stable  Post vital signs: Reviewed and stable  Last Vitals:  Filed Vitals:   07/20/15 0818  BP: 119/86  Pulse: 88  Temp: 36.8 C  Resp: 16    Last Pain:  Filed Vitals:   07/20/15 0819  PainSc: 7       Patients Stated Pain Goal: 4 (07/20/15 0805)  Complications: No apparent anesthesia complications

## 2015-07-23 ENCOUNTER — Encounter (HOSPITAL_COMMUNITY): Payer: Self-pay | Admitting: Anesthesiology

## 2015-10-26 ENCOUNTER — Encounter: Payer: Self-pay | Admitting: Internal Medicine

## 2015-10-26 ENCOUNTER — Ambulatory Visit (INDEPENDENT_AMBULATORY_CARE_PROVIDER_SITE_OTHER): Payer: BLUE CROSS/BLUE SHIELD | Admitting: Internal Medicine

## 2015-10-26 ENCOUNTER — Telehealth: Payer: Self-pay | Admitting: Internal Medicine

## 2015-10-26 ENCOUNTER — Encounter (INDEPENDENT_AMBULATORY_CARE_PROVIDER_SITE_OTHER): Payer: Self-pay

## 2015-10-26 VITALS — BP 140/80 | HR 112 | Ht 78.0 in | Wt 276.0 lb

## 2015-10-26 DIAGNOSIS — R740 Nonspecific elevation of levels of transaminase and lactic acid dehydrogenase [LDH]: Secondary | ICD-10-CM

## 2015-10-26 DIAGNOSIS — R000 Tachycardia, unspecified: Secondary | ICD-10-CM

## 2015-10-26 DIAGNOSIS — R030 Elevated blood-pressure reading, without diagnosis of hypertension: Secondary | ICD-10-CM

## 2015-10-26 DIAGNOSIS — M7989 Other specified soft tissue disorders: Secondary | ICD-10-CM | POA: Diagnosis not present

## 2015-10-26 DIAGNOSIS — R7401 Elevation of levels of liver transaminase levels: Secondary | ICD-10-CM

## 2015-10-26 LAB — COMPREHENSIVE METABOLIC PANEL
ALT: 94 U/L — AB (ref 17–63)
AST: 63 U/L — ABNORMAL HIGH (ref 15–41)
Albumin: 4.4 g/dL (ref 3.5–5.0)
Alkaline Phosphatase: 66 U/L (ref 38–126)
Anion gap: 10 (ref 5–15)
BILIRUBIN TOTAL: 0.5 mg/dL (ref 0.3–1.2)
BUN: 5 mg/dL — ABNORMAL LOW (ref 6–20)
CHLORIDE: 104 mmol/L (ref 101–111)
CO2: 26 mmol/L (ref 22–32)
CREATININE: 0.94 mg/dL (ref 0.61–1.24)
Calcium: 10.2 mg/dL (ref 8.9–10.3)
Glucose, Bld: 82 mg/dL (ref 65–99)
POTASSIUM: 4.4 mmol/L (ref 3.5–5.1)
Sodium: 140 mmol/L (ref 135–145)
TOTAL PROTEIN: 6.7 g/dL (ref 6.5–8.1)

## 2015-10-26 LAB — CBC WITH DIFFERENTIAL/PLATELET
BASOS ABS: 0 10*3/uL (ref 0.0–0.1)
Basophils Relative: 0 %
EOS ABS: 0.2 10*3/uL (ref 0.0–0.7)
EOS PCT: 2 %
HCT: 44.3 % (ref 39.0–52.0)
HEMOGLOBIN: 15.4 g/dL (ref 13.0–17.0)
LYMPHS ABS: 3.2 10*3/uL (ref 0.7–4.0)
Lymphocytes Relative: 35 %
MCH: 32.2 pg (ref 26.0–34.0)
MCHC: 34.8 g/dL (ref 30.0–36.0)
MCV: 92.7 fL (ref 78.0–100.0)
Monocytes Absolute: 0.8 10*3/uL (ref 0.1–1.0)
Monocytes Relative: 9 %
NEUTROS PCT: 54 %
Neutro Abs: 4.9 10*3/uL (ref 1.7–7.7)
PLATELETS: 216 10*3/uL (ref 150–400)
RBC: 4.78 MIL/uL (ref 4.22–5.81)
RDW: 13 % (ref 11.5–15.5)
WBC: 9.1 10*3/uL (ref 4.0–10.5)

## 2015-10-26 LAB — TSH: TSH: 1.102 u[IU]/mL (ref 0.350–4.500)

## 2015-10-26 NOTE — Patient Instructions (Signed)
Medication Instructions:  Your physician has recommended you make the following change in your medication:  STOP Aspirin   Labwork: Cmet, Tsh , Cbc today  Testing/Procedures: Your physician has requested that you have an echocardiogram. Echocardiography is a painless test that uses sound waves to create images of your heart. It provides your doctor with information about the size and shape of your heart and how well your heart's chambers and valves are working. This procedure takes approximately one hour. There are no restrictions for this procedure.  Your physician has requested that you have a lower extremity venous duplex. During this test, exercise and ultrasound are used to evaluate venous blood flow in the legs. Allow one hour for this exam. There are no restrictions or special instructions.   Follow-Up: Your physician recommends that you schedule a follow-up appointment in: 4 weeks with Dr.End   Any Other Special Instructions Will Be Listed Below (If Applicable).     If you need a refill on your cardiac medications before your next appointment, please call your pharmacy.

## 2015-10-26 NOTE — Telephone Encounter (Signed)
I spoke with the patient regarding the results of his labs, which were unremarkable except for mild transaminitis.  CBC, TSH, and renal function/electrolytes normal.  I advised pt to refrain from drinking any alcohol and to speak with his PCP next week about further evaluation of transaminitis.  Yvonne Kendallhristopher Shannah Conteh, MD [facility] Pager: [phone]

## 2015-10-26 NOTE — Progress Notes (Signed)
New Outpatient Visit Date: 10/26/2015  Referring Provider: Michiel Cowboy, PA-C [facility]  [address]  [place], [place] [address]  Chief Complaint: Leg swelling  HPI:  Tyler Wood is a 50 y.o. year-old male with history of hypertension, type 2 diabetes mellitus, GERD, obstructive sleep apnea, tobacco use, and low back pain status post lumbar fusion and spinal cord stimulator, who has been referred by Tyler Wood for evaluation of leg swelling. He reports that leg swelling developed after his first back surgery in 02/2014. He reports that this has persisted since that time, though it improves with furosemide. However, he does not like using furosemide due to the increased urinary frequency. He has continuous pain in both legs that he describes as ache. It waxes and wanes in intensity but never completely disappears. The maximal intensity is 7/10. He notes some relief with oxycodone, which she also takes for his chronic back pain. Prior to his back surgery, he experienced frequent "lightning" pains radiating down both legs. These are no longer present. He denies prior evaluation of his leg edema. He has not worn compression stockings outside in the hospital. 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  He denies shortness of breath, orthopnea, and PND. He sleeps on 2 pillows for comfort and wears CPAP nightly. Also denies chest pain, palpitations, and lightheadedness. His mobility is quite limited owing to chronic back pain. He walks with a cane.   Tyler Wood also notes intermittent bright red blood per rectum over the last 2 weeks.  He denies abdominal pain.  --------------------------------------------------------------------------------------------------  Cardiovascular History & Procedures: Cardiovascular Problems:  Chronic lower extremity edema  Risk Factors:  Diabetes, hypertension, tobacco use, and male  gender  Cath/PCI:  None.  CV Surgery:  None.  EP Procedures and Devices:  None.  Non-Invasive Evaluation(s):  None.  Recent CV Pertinent Labs: Lab Results  Component Value Date   INR 1.21 03/01/2014   K 4.4 10/26/2015   BUN 5 (L) 10/26/2015   CREATININE 0.94 10/26/2015   Outside lipid panel (04/24/14): Total cholesterol 205, triglycerides 339, HDL 35, LDL 102  --------------------------------------------------------------------------------------------------  Past Medical History:  Diagnosis Date  . Arthritis    back  . Diabetes mellitus without complication (HCC)   . GERD (gastroesophageal reflux disease)   . Hypertension   . Sleep apnea    Uses CPAP    Past Surgical History:  Procedure Laterality Date  . BACK SURGERY     Fused on Feb. 17, 2016  . FORAMINOTOMY 1 LEVEL  01/12/2015   L5 S1  . LUMBAR LAMINECTOMY/DECOMPRESSION MICRODISCECTOMY Right 01/12/2015   Procedure: right Lumbar five -sacral one Foraminotomy;  Surgeon: Hilda Lias, MD;  Location: [facility];  Service: Neurosurgery;  Laterality: Right;  . SPINAL CORD STIMULATOR INSERTION N/A 07/20/2015   Procedure: LUMBAR SPINAL CORD STIMULATOR INSERTION;  Surgeon: Odette Fraction, MD;  Location: [facility];  Service: Neurosurgery;  Laterality: N/A;  Lumbar/thoracic spine    Outpatient Encounter Prescriptions as of 10/26/2015  Medication Sig  . diazepam (VALIUM) 10 MG tablet Take 10 mg by mouth every 8 (eight) hours as needed (for muscle spasms).   . furosemide (LASIX) 20 MG tablet Take 20 mg by mouth daily.  Marland Kitchen glimepiride (AMARYL) 2 MG tablet Take 2 mg by mouth every morning.  Marland Kitchen lisinopril-hydrochlorothiazide (PRINZIDE,ZESTORETIC) 20-12.5 MG tablet Take 1 tablet by mouth daily.  Marland Kitchen LYRICA 150 MG capsule Take 150 mg by mouth  3 (three) times daily.  . metFORMIN (GLUCOPHAGE) 500 MG tablet Take 500 mg by mouth 2 (two) times daily.  . Multiple Vitamins-Minerals (MULTIVITAMIN WITH MINERALS) tablet Take 1  tablet by mouth daily.  . mupirocin ointment (BACTROBAN) 2 % Apply 1 application topically daily as needed (for wound healing).   . Omega-3 Fatty Acids (FISH OIL) 1000 MG CAPS Take 1,000 mg by mouth daily.  Marland Kitchen. omeprazole (PRILOSEC) 20 MG capsule Take 20 mg by mouth daily.  Marland Kitchen. oxycodone (ROXICODONE) 30 MG immediate release tablet Take 30 mg by mouth every 4 (four) hours as needed for pain.   . [DISCONTINUED] aspirin 81 MG tablet Take 81 mg by mouth daily.  . [DISCONTINUED] cephALEXin (KEFLEX) 500 MG capsule Take 1 capsule (500 mg total) by mouth 3 (three) times daily.  . [DISCONTINUED] Omega-3 Fatty Acids (FISH OIL PO) Take 1 capsule by mouth daily.   No facility-administered encounter medications on file as of 10/26/2015.     Allergies: Review of patient's allergies indicates no known allergies.  Social History   Social History  . Marital status: Married    Spouse name: N/A  . Number of children: N/A  . Years of education: N/A   Occupational History  . Not on file.   Social History Main Topics  . Smoking status: Current Every Day Smoker    Packs/day: 1.50    Years: 25.00  . Smokeless tobacco: Never Used  . Alcohol use 8.4 oz/week    14 Cans of beer per week  . Drug use: No  . Sexual activity: Not on file   Other Topics Concern  . Not on file   Social History Narrative  . No narrative on file    Family History  Problem Relation Age of Onset  . Brain cancer Mother   . Heart attack Father 2750    MI and CABG  . Lung cancer Father   . Brain cancer Brother     Review of Systems: A 12-system review of systems was performed and was negative except as noted in the HPI.  --------------------------------------------------------------------------------------------------  Physical Exam: BP 140/80   Pulse (!) 112   Ht 6\' 6"  (1.981 m)   Wt 276 lb (125.2 kg)   BMI 31.90 kg/m   General:  Overweight man, seated comfortably on the exam table.  He is accompanied by his  wife. HEENT: No conjunctival pallor or scleral icterus.  Moist mucous membranes.  OP clear. Neck: Supple without lymphadenopathy, thyromegaly, JVD, or HJR.  No carotid bruit. Lungs: Normal work of breathing.  Clear to auscultation bilaterally without wheezes or crackles. Heart: Tachycardic but regular without murmurs, rubs, or gallops.  Non-displaced PMI. Abd: Bowel sounds present.  Soft, NT/ND without hepatosplenomegaly Ext: 1+ pretibial edema to the knees.  Radial, PT, and DP pulses are 2+ bilaterally Skin: warm and dry without rash Neuro: Antalgic gait.  Walks with a cane. Psych: Normal mood and affect.  EKG:  Sinus tachycardia (HR 124 bpm).  Otherwise not significant abnormalities.  Lab Results  Component Value Date   WBC 9.1 10/26/2015   HGB 15.4 10/26/2015   HCT 44.3 10/26/2015   MCV 92.7 10/26/2015   PLT 216 10/26/2015    Lab Results  Component Value Date   NA 140 10/26/2015   K 4.4 10/26/2015   CL 104 10/26/2015   CO2 26 10/26/2015   BUN 5 (L) 10/26/2015   CREATININE 0.94 10/26/2015   GLUCOSE 82 10/26/2015   ALT 94 (H) 10/26/2015  Outside lipid panel (04/24/14): Total cholesterol 205, triglycerides 339, HDL 35, LDL 102  --------------------------------------------------------------------------------------------------  ASSESSMENT AND PLAN: 1. Leg swelling Mild leg edema is noted on exam today.  The patient notes that his swelling fluctuates and is good today.  Considerations include venous insufficiency, DVT (would need to be chronic given duration of symptoms), and heart failure.  He denies other symptoms of heart failure and does not have lung crackles or elevated JVP on exam today.  We will proceed with echocardiogram and bilateral lower extremity venous duplex exam.  The patient can continue with furosemide as previously prescribed.  2. Tachycardia Patient noted to have sinus tachycardia today without clear etiology.  Labs obtained today demonstrate normal  hemoglobin and TSH.  Mild transaminitis was seen, which may reflect hepatitis.  Review of the patient's most recent visit with his PCP on 09/28/15 was notable for mild sinus tachycardia at that time as well (HR 102).  Pain may also be a contributor to his tachycardia. We will proceed with echocardiogram, as noted above.  3. Transaminitis Mild elevation in ALT>AST noted today, which is new.  I have advised the patient to avoid all alcohol and to contact his PCP next week for further evalation.  4. Hypertension Blood pressure modestly elevated today, improved and borderline elevated on recheck.  We will not make any medication changes today, though he may benefit from more aggressive treatment in the future if his BP remains elevated.  5. Bright red blood per rectum Patient has noticed this several times over the last 2 weeks.  His hemoglobin today is normal.  I have advised him to contact his PCP as soon as possible for further evaluation and possible GI referral.  I have also recommended that he stop taking aspirin for the time being.  Follow-up: Return to clinic in 4 weeks.  Yvonne Kendall, MD 10/27/2015 11:31 AM

## 2015-10-29 ENCOUNTER — Telehealth: Payer: Self-pay | Admitting: Pediatrics

## 2015-10-29 NOTE — Telephone Encounter (Signed)
Appt tomorrow 11:30.

## 2015-10-29 NOTE — Telephone Encounter (Signed)
-----   Message from Yvonne Kendallhristopher End, MD sent at 10/28/2015 12:04 PM EDT ----- Regarding: Expedited echo Hi Misty,  I saw Mr. Grieshaber on Friday for evaluation of edema.  Would it be possible to expedite his echo (I believe it is currently scheduled for 11/3)?  It would be great if we could work him in this week (the sooner the better).  Thanks for your help.  Thayer Ohmhris

## 2015-10-29 NOTE — Telephone Encounter (Signed)
Pt called back. He was advised of plan. We sch ECHO tomorrow at 1130. He voiced understanding and agreed with plan.

## 2015-10-29 NOTE — Telephone Encounter (Signed)
[facility] She states she will call Mr Tyler Wood to see if we can get him in early this week.

## 2015-10-29 NOTE — Telephone Encounter (Signed)
I called pt to see if a certain date/time would NOT work for him. I LMTCB.

## 2015-10-30 ENCOUNTER — Other Ambulatory Visit: Payer: Self-pay

## 2015-10-30 ENCOUNTER — Ambulatory Visit (HOSPITAL_COMMUNITY): Payer: BLUE CROSS/BLUE SHIELD | Attending: Cardiology

## 2015-10-30 DIAGNOSIS — R000 Tachycardia, unspecified: Secondary | ICD-10-CM | POA: Insufficient documentation

## 2015-10-30 DIAGNOSIS — M7989 Other specified soft tissue disorders: Secondary | ICD-10-CM | POA: Insufficient documentation

## 2015-10-31 ENCOUNTER — Telehealth: Payer: Self-pay | Admitting: Internal Medicine

## 2015-10-31 ENCOUNTER — Telehealth (HOSPITAL_COMMUNITY): Payer: Self-pay | Admitting: Internal Medicine

## 2015-10-31 NOTE — Telephone Encounter (Signed)
New Message:  Pt called in wanting to speak with Dr. Serita KyleEnd's nurse about the results to the echo he had yesterday. Pt sounds anxious about the test  Showed. Please contact patient as soon as possible.   Thanks

## 2015-10-31 NOTE — Telephone Encounter (Signed)
Patient is calling back he has one more question to ask regarding his echo results.

## 2015-10-31 NOTE — Telephone Encounter (Signed)
I advised patient of the results and recommendations. I reminded of upcoming study on 11/3 and f/u with Dr End 11/10. He voiced thanks, understanding, and agreed with plan.  Please see result note.

## 2015-10-31 NOTE — Telephone Encounter (Signed)
Patient states he would like 10/26/15 labs sent to PCP [facility] has appt 11/02/15. I sent records. Pt denies questions about ECHO and this time.

## 2015-11-14 ENCOUNTER — Ambulatory Visit (HOSPITAL_COMMUNITY)
Admission: RE | Admit: 2015-11-14 | Discharge: 2015-11-14 | Disposition: A | Discharge status: Home / Self Care | Payer: BLUE CROSS/BLUE SHIELD | Source: Ambulatory Visit | Attending: Internal Medicine | Admitting: Internal Medicine

## 2015-11-14 DIAGNOSIS — M7989 Other specified soft tissue disorders: Secondary | ICD-10-CM | POA: Diagnosis not present

## 2015-11-16 ENCOUNTER — Encounter (HOSPITAL_COMMUNITY): Payer: BLUE CROSS/BLUE SHIELD

## 2015-11-16 ENCOUNTER — Other Ambulatory Visit (HOSPITAL_COMMUNITY): Payer: BLUE CROSS/BLUE SHIELD

## 2015-11-23 ENCOUNTER — Encounter: Payer: Self-pay | Admitting: Internal Medicine

## 2015-11-23 ENCOUNTER — Ambulatory Visit (INDEPENDENT_AMBULATORY_CARE_PROVIDER_SITE_OTHER): Payer: BLUE CROSS/BLUE SHIELD | Admitting: Internal Medicine

## 2015-11-23 VITALS — BP 126/80 | HR 80 | Ht 78.0 in | Wt 283.0 lb

## 2015-11-23 DIAGNOSIS — I10 Essential (primary) hypertension: Secondary | ICD-10-CM

## 2015-11-23 DIAGNOSIS — M7989 Other specified soft tissue disorders: Secondary | ICD-10-CM

## 2015-11-23 MED ORDER — CARVEDILOL 3.125 MG PO TABS: 3.1250 mg | ORAL_TABLET | Freq: Two times a day (BID) | ORAL | 1 refills | Status: DC

## 2015-11-23 NOTE — Patient Instructions (Addendum)
Medication Instructions:  Start coreg (carvedilol) 3.125mg  two times a day  Labwork: None  Testing/Procedures: none  Follow-Up: Your physician recommends that you schedule a follow-up appointment in: 3 months with Dr End. (February 2018).    Any Other Special Instructions Will Be Listed Below (If Applicable).  Use knee high compression stockings  to help the swelling in your feet and legs. Put them on in the morning and take them off at night. Limit the use of salt (sodium) in your diet. Keep your feet and legs elevated as much as you can during the day.    If you need a refill on your cardiac medications before your next appointment, please call your pharmacy.

## 2015-11-23 NOTE — Progress Notes (Signed)
Follow-up Outpatient Visit Date: 11/23/2015  Chief Complaint: Follow-up leg swelling  HPI:  Tyler Wood is a 50 y.o. year-old male with history of hypertension, type 2 diabetes mellitus, GERD, obstructive sleep apnea, tobacco use, and low back pain status post lumbar fusion and spinal cord stimulator, who presents for follow-up of leg swelling.  I first met him on 10/26/15, at which time he reported significant leg swelling that began after his first back surgery in 02/2014. He had noted some improvement with furosemide, though it never completely resolved. Subsequent evaluation with transthoracic echocardiogram and a large deep venous duplex ultrasound was unremarkable with the exception of mild LVH and grade 1 diastolic dysfunction. His leg swelling has not improved since our last visit.  He continues to take furosemide 20 mg daily and has to urinate throughout the day and night quite frequently.  He has not tried wearing compression stockings.  He adds salt to most of his food.  He also notes intermittent shooting pains in both legs and feet, worse on the right.  Mr. Pasillas denies chest pain and shortness of breath.  He wears his CPAP most nights.  He notes some issues with his spinal stimulator recently, though this seems to have been sorted out.  He will be seeing his pain provider next week.  --------------------------------------------------------------------------------------------------  Cardiovascular History & Procedures: Cardiovascular Problems:  Chronic lower extremity edema  Risk Factors:  Diabetes, hypertension, tobacco use, and male gender  Cath/PCI:  None.  CV Surgery:  None.  EP Procedures and Devices:  None.  Non-Invasive Evaluation(s):  Transthoracic echocardiogram (10/30/15): Normal LV size with mild LVH. LVEF 55-60%. Grade 1 diastolic dysfunction. Mitral annular calcification with trace MR. Trace TR.  Recent CV Pertinent Labs: Labs(Brief)       Lab  Results  Component Value Date   INR 1.21 03/01/2014   K 4.4 10/26/2015   BUN 5 (L) 10/26/2015   CREATININE 0.94 10/26/2015     Outside lipid panel (04/24/14): Total cholesterol 205, triglycerides 339, HDL 35, LDL 102  Past medical and surgical history were reviewed and updated in EPIC.   Outpatient Encounter Prescriptions as of 11/23/2015  Medication Sig  . diazepam (VALIUM) 10 MG tablet Take 10 mg by mouth every 8 (eight) hours as needed (for muscle spasms).   . furosemide (LASIX) 20 MG tablet Take 20 mg by mouth daily.  Marland Kitchen glimepiride (AMARYL) 2 MG tablet Take 2 mg by mouth every morning.  Marland Kitchen lisinopril-hydrochlorothiazide (PRINZIDE,ZESTORETIC) 20-12.5 MG tablet Take 1 tablet by mouth daily.  Marland Kitchen LYRICA 150 MG capsule Take 150 mg by mouth 3 (three) times daily.  . metFORMIN (GLUCOPHAGE) 500 MG tablet Take 500 mg by mouth 2 (two) times daily.  . Multiple Vitamins-Minerals (MULTIVITAMIN WITH MINERALS) tablet Take 1 tablet by mouth daily.  . mupirocin ointment (BACTROBAN) 2 % Apply 1 application topically daily as needed (for wound healing).   . Omega-3 Fatty Acids (FISH OIL) 1000 MG CAPS Take 1,000 mg by mouth daily.  Marland Kitchen omeprazole (PRILOSEC) 20 MG capsule Take 20 mg by mouth daily.  Marland Kitchen oxycodone (ROXICODONE) 30 MG immediate release tablet Take 30 mg by mouth every 4 (four) hours as needed for pain.    No facility-administered encounter medications on file as of 11/23/2015.     Allergies: Patient has no known allergies.  Social History   Social History  . Marital status: Married    Spouse name: N/A  . Number of children: N/A  . Years of education:  N/A   Occupational History  . Not on file.   Social History Main Topics  . Smoking status: Current Every Day Smoker    Packs/day: 1.50    Years: 25.00  . Smokeless tobacco: Never Used  . Alcohol use 8.4 oz/week    14 Cans of beer per week  . Drug use: No  . Sexual activity: Not on file   Other Topics Concern  . Not on  file   Social History Narrative  . No narrative on file    Family History  Problem Relation Age of Onset  . Brain cancer Mother   . Heart attack Father 5    MI and CABG  . Lung cancer Father   . Brain cancer Brother     Review of Systems: Review of Systems  Constitutional: Negative.   HENT: Negative.   Respiratory: Negative.   Cardiovascular: Positive for leg swelling. Negative for chest pain.  Gastrointestinal: Negative.   Genitourinary: Positive for frequency.  Musculoskeletal: Positive for back pain and myalgias.  Skin: Negative.   Neurological: Negative.   Endo/Heme/Allergies: Negative.   Psychiatric/Behavioral: Negative.     --------------------------------------------------------------------------------------------------  Physical Exam: BP 126/80   Pulse 80   Ht '6\' 6"'  (1.981 m)   Wt 283 lb (128.4 kg)   BMI 32.70 kg/m   General:  Overweight man, seated comfortably in the exam room. Neck: Supple without lymphadenopathy, thyromegaly, or JVD. Lungs: Normal work of breathing.  Clear to auscultation bilaterally without wheezes or crackles. Heart: Regular rate and rhythm without murmurs, rubs, or gallops.  Non-displaced PMI. Ext: 1+ pretibial edema bilaterally. 2+ pedal pulses bilaterally. Skin: excoriations on the right shin.   Lab Results  Component Value Date   WBC 9.1 10/26/2015   HGB 15.4 10/26/2015   HCT 44.3 10/26/2015   MCV 92.7 10/26/2015   PLT 216 10/26/2015    Lab Results  Component Value Date   NA 140 10/26/2015   K 4.4 10/26/2015   CL 104 10/26/2015   CO2 26 10/26/2015   BUN 5 (L) 10/26/2015   CREATININE 0.94 10/26/2015   GLUCOSE 82 10/26/2015   ALT 94 (H) 10/26/2015   --------------------------------------------------------------------------------------------------  ASSESSMENT AND PLAN: Lower extremity edema: This is unchanged since our last visit.  Echo showed grade 1 diastolic dysfunction.  Lower extremity venous duplex was  normal.  I suspect his chronic edema may be due to venous insufficiency, high-salt diet, and possibly an element of diastolic dysfunction.  We have agreed to start carvedilol 3.125 mg BID.  I encouraged the patient to limit his salt intake and wear compression stockings on a regular basis.  Hypertension: Blood pressure is well-controlled today.  We will add low-dose carvedilol, as above, for diastolic dysfunction noted on recent echo.  Follow-up: Return to clinic in 3 months.  Nelva Bush, MD 11/23/2015 7:10 AM

## 2016-02-21 ENCOUNTER — Encounter: Payer: Self-pay | Admitting: Internal Medicine

## 2016-02-25 ENCOUNTER — Encounter: Payer: Self-pay | Admitting: *Deleted

## 2016-02-25 ENCOUNTER — Encounter: Payer: Self-pay | Admitting: Internal Medicine

## 2016-02-25 ENCOUNTER — Ambulatory Visit (INDEPENDENT_AMBULATORY_CARE_PROVIDER_SITE_OTHER): Payer: BLUE CROSS/BLUE SHIELD | Admitting: Internal Medicine

## 2016-02-25 VITALS — BP 130/90 | HR 99 | Ht 78.0 in | Wt 306.4 lb

## 2016-02-25 DIAGNOSIS — I10 Essential (primary) hypertension: Secondary | ICD-10-CM

## 2016-02-25 DIAGNOSIS — M7989 Other specified soft tissue disorders: Secondary | ICD-10-CM | POA: Diagnosis not present

## 2016-02-25 DIAGNOSIS — Z720 Tobacco use: Secondary | ICD-10-CM | POA: Diagnosis not present

## 2016-02-25 MED ORDER — CARVEDILOL 6.25 MG PO TABS: 6.2500 mg | ORAL_TABLET | Freq: Two times a day (BID) | ORAL | 3 refills | Status: DC

## 2016-02-25 NOTE — Progress Notes (Signed)
Follow-up Outpatient Visit Date: 02/25/2016  Chief Complaint: Follow-up leg swelling  HPI:  Tyler Wood is a 51 y.o. year-old male with history of hypertension, type 2 diabetes mellitus, GERD, obstructive sleep apnea on CPAP, tobacco use, and low back pain status post lumbar fusion and spinal cord stimulator, who presents for follow-up of leg swelling.  I first met him on 10/26/15, at which time he reported significant leg swelling that began after his first back surgery in 02/2014. He had noted some improvement with furosemide, though it never completely resolved. Subsequent evaluation with transthoracic echocardiogram and a large deep venous duplex ultrasound was unremarkable with the exception of mild LVH and grade 1 diastolic dysfunction. Lower extremity venous duplex showed no evidence of DVT or other venous obstruction.  Today, Tyler Wood reports that his leg swelling has been about the same since our last visit. However, he no longer has episodes during which his legs "blow up." He has gain weight, which he attributes to a lack of exercise and poor diet. He remains unable to exercise due to his chronic back problems. He denies chest pain, shortness of breath, and palpitations. He wears CPAP most nights and sleeps without any difficulties (other than having to get up overnight or in the early morning due to a stiff back). After our last visit, we added low-dose carvedilol due to diastolic dysfunction noted on echo; Mr. Lantzy has not noticed any improvement. He also denies side effects. He does not watch his salt intake. He wears compression stocking on occasion but has difficulty putting them on and also notes some discomfort wearing them. Swelling was not significantly impacted by compression hose.  --------------------------------------------------------------------------------------------------  Cardiovascular History & Procedures: Cardiovascular Problems:  Chronic lower extremity edema  Risk  Factors:  Diabetes, hypertension, tobacco use, and male gender  Cath/PCI:  None.  CV Surgery:  None.  EP Procedures and Devices:  None.  Non-Invasive Evaluation(s):  Bilateral lower extremity venous duplex (11/14/15): No evidence of lower extremity DVT or venous incompetence.  Transthoracic echocardiogram (10/30/15): Normal LV size with mild LVH. LVEF 55-60%. Grade 1 diastolic dysfunction. Mitral annular calcification with trace MR. Trace TR.  Recent CV Pertinent Labs: Lab Results  Component Value Date   INR 1.21 03/01/2014   K 4.4 10/26/2015   BUN 5 (L) 10/26/2015   CREATININE 0.94 10/26/2015   Outside lipid panel (04/24/14): Total cholesterol 205, triglycerides 339, HDL 35, LDL 102  Past medical and surgical history were reviewed and updated in EPIC.   Outpatient Encounter Prescriptions as of 02/25/2016  Medication Sig  . diazepam (VALIUM) 10 MG tablet Take 10 mg by mouth every 8 (eight) hours as needed (for muscle spasms).   . furosemide (LASIX) 20 MG tablet Take 20 mg by mouth daily.  Marland Kitchen lisinopril-hydrochlorothiazide (PRINZIDE,ZESTORETIC) 20-12.5 MG tablet Take 1 tablet by mouth daily.  Marland Kitchen LYRICA 150 MG capsule Take 150 mg by mouth 3 (three) times daily.  . metFORMIN (GLUCOPHAGE) 500 MG tablet Take 500 mg by mouth 2 (two) times daily.  . Multiple Vitamins-Minerals (MULTIVITAMIN WITH MINERALS) tablet Take 1 tablet by mouth daily.  . Omega-3 Fatty Acids (FISH OIL) 1000 MG CAPS Take 1,000 mg by mouth daily.  Marland Kitchen omeprazole (PRILOSEC) 20 MG capsule Take 20 mg by mouth daily.  Marland Kitchen oxycodone (ROXICODONE) 30 MG immediate release tablet Take 30 mg by mouth every 4 (four) hours as needed for pain.   . [DISCONTINUED] carvedilol (COREG) 3.125 MG tablet Take 3.125 mg by mouth 2 (two)  times daily with a meal.  . carvedilol (COREG) 6.25 MG tablet Take 1 tablet (6.25 mg total) by mouth 2 (two) times daily.  . [DISCONTINUED] carvedilol (COREG) 3.125 MG tablet Take 1 tablet (3.125 mg  total) by mouth 2 (two) times daily.   No facility-administered encounter medications on file as of 02/25/2016.     Allergies: Patient has no known allergies.  Social History   Social History  . Marital status: Married    Spouse name: N/A  . Number of children: N/A  . Years of education: N/A   Occupational History  . Not on file.   Social History Main Topics  . Smoking status: Current Every Day Smoker    Packs/day: 1.50    Years: 25.00  . Smokeless tobacco: Never Used  . Alcohol use 8.4 oz/week    14 Cans of beer per week  . Drug use: No  . Sexual activity: Not on file   Other Topics Concern  . Not on file   Social History Narrative  . No narrative on file    Family History  Problem Relation Age of Onset  . Brain cancer Mother   . Heart attack Father 79    MI and CABG  . Lung cancer Father   . Brain cancer Brother     Review of Systems: Review of Systems  Constitutional: Negative.   HENT: Negative.        Snoring  Respiratory: Negative.   Cardiovascular: Positive for leg swelling. Negative for chest pain.  Gastrointestinal: Negative.   Genitourinary: Positive for frequency.  Musculoskeletal: Positive for back pain, falls (balance problems since back surgeries) and myalgias (legs).  Skin: Negative.   Neurological: Negative.   Endo/Heme/Allergies: Negative.   Psychiatric/Behavioral: Negative.    --------------------------------------------------------------------------------------------------  Physical Exam: BP 130/90   Pulse 99   Ht '6\' 6"'  (1.981 m)   Wt (!) 306 lb 6.4 oz (139 kg)   SpO2 95%   BMI 35.41 kg/m   General:  Obese man, seated comfortably in the exam room. Neck: Supple without lymphadenopathy, thyromegaly, JVD or HJR, though body habitus limits evaluation. Lungs: Normal work of breathing.  Clear to auscultation bilaterally without wheezes or crackles. Heart: Regular rate and rhythm without murmurs, rubs, or gallops.  Non-displaced  PMI. Ext: 1+ pretibial edema bilaterally. 2+ redial and pedal pulses bilaterally Skin: excoriations on the right shin.   Lab Results  Component Value Date   WBC 9.1 10/26/2015   HGB 15.4 10/26/2015   HCT 44.3 10/26/2015   MCV 92.7 10/26/2015   PLT 216 10/26/2015    Lab Results  Component Value Date   NA 140 10/26/2015   K 4.4 10/26/2015   CL 104 10/26/2015   CO2 26 10/26/2015   BUN 5 (L) 10/26/2015   CREATININE 0.94 10/26/2015   GLUCOSE 82 10/26/2015   ALT 94 (H) 10/26/2015   --------------------------------------------------------------------------------------------------  ASSESSMENT AND PLAN: Lower extremity edema: No significantly changes from prior visit. Weight is up, which patient attributes to sedentary lifestyle and poor diet. We will increase carvedilol to 6.25 mg BID for improved heart rate and blood pressure control in the setting of diastolic dysfunction. We will continue current dose of furosemide, though I advised the patient to take an extra dose if he notices worsening edema or weight gain. We spoke at length again regarding the importance of salt restriction. I encouraged him to continue trying compression stockings.   Hypertension: Blood pressure borderline elevated today. As above, we will  increase carvedilol tyo 6.25 mg BID.  Tobacco use Patient continues to smoke. We discussed the importance of quitting, given his multiple health issues.  Follow-up: Return to clinic in 4 months.   Nelva Bush, MD 02/25/2016 1:56 PM

## 2016-02-25 NOTE — Patient Instructions (Addendum)
Medication Instructions:  Increase coreg (carvedilol) to 6.25mg  two times a day. You can take 2 of your 3.125mg  tablets two times a day and use your current supply.  Labwork: None   Testing/Procedures: None  Follow-Up: Your physician recommends that you schedule a follow-up appointment in: 4 months with Dr End.      DASH Eating Plan DASH stands for "Dietary Approaches to Stop Hypertension." The DASH eating plan is a healthy eating plan that has been shown to reduce high blood pressure (hypertension). Additional health benefits may include reducing the risk of type 2 diabetes mellitus, heart disease, and stroke. The DASH eating plan may also help with weight loss. What do I need to know about the DASH eating plan? For the DASH eating plan, you will follow these general guidelines:  Choose foods with less than 150 milligrams of sodium per serving (as listed on the food label).  Use salt-free seasonings or herbs instead of table salt or sea salt.  Check with your health care provider or pharmacist before using salt substitutes.  Eat lower-sodium products. These are often labeled as "low-sodium" or "no salt added."  Eat fresh foods. Avoid eating a lot of canned foods.  Eat more vegetables, fruits, and low-fat dairy products.  Choose whole grains. Look for the word "whole" as the first word in the ingredient list.  Choose fish and skinless chicken or [place] more often than red meat. Limit fish, poultry, and meat to 6 oz (170 g) each day.  Limit sweets, desserts, sugars, and sugary drinks.  Choose heart-healthy fats.  Eat more home-cooked food and less restaurant, buffet, and fast food.  Limit fried foods.  Do not fry foods. Cook foods using methods such as baking, boiling, grilling, and broiling instead.  When eating at a restaurant, ask that your food be prepared with less salt, or no salt if possible. What foods can I eat? Seek help from a dietitian for individual  calorie needs. Grains  Whole grain or whole wheat bread. Brown rice. Whole grain or whole wheat pasta. Quinoa, bulgur, and whole grain cereals. Low-sodium cereals. Corn or whole wheat flour tortillas. Whole grain cornbread. Whole grain crackers. Low-sodium crackers. Vegetables  Fresh or frozen vegetables (raw, steamed, roasted, or grilled). Low-sodium or reduced-sodium tomato and vegetable juices. Low-sodium or reduced-sodium tomato sauce and paste. Low-sodium or reduced-sodium canned vegetables. Fruits  All fresh, canned (in natural juice), or frozen fruits. Meat and Other Protein Products  Ground beef (85% or leaner), grass-fed beef, or beef trimmed of fat. Skinless chicken or [place]. Ground chicken or [place]. Pork trimmed of fat. All fish and seafood. Eggs. Dried beans, peas, or lentils. Unsalted nuts and seeds. Unsalted canned beans. Dairy  Low-fat dairy products, such as skim or 1% milk, 2% or reduced-fat cheeses, low-fat ricotta or cottage cheese, or plain low-fat yogurt. Low-sodium or reduced-sodium cheeses. Fats and Oils  Tub margarines without trans fats. Light or reduced-fat mayonnaise and salad dressings (reduced sodium). Avocado. Safflower, olive, or canola oils. Natural peanut or almond butter. Other  Unsalted popcorn and pretzels. The items listed above may not be a complete list of recommended foods or beverages. Contact your dietitian for more options.  What foods are not recommended? Grains  White bread. White pasta. White rice. Refined cornbread. Bagels and croissants. Crackers that contain trans fat. Vegetables  Creamed or fried vegetables. Vegetables in a cheese sauce. Regular canned vegetables. Regular canned tomato sauce and paste. Regular tomato and vegetable juices. Fruits  Canned fruit in light or heavy syrup. Fruit juice. Meat and Other Protein Products  Fatty cuts of meat. Ribs, chicken wings, bacon, sausage, bologna, salami, chitterlings, fatback, hot dogs,  bratwurst, and packaged luncheon meats. Salted nuts and seeds. Canned beans with salt. Dairy  Whole or 2% milk, cream, half-and-half, and cream cheese. Whole-fat or sweetened yogurt. Full-fat cheeses or blue cheese. Nondairy creamers and whipped toppings. Processed cheese, cheese spreads, or cheese curds. Condiments  Onion and garlic salt, seasoned salt, table salt, and sea salt. Canned and packaged gravies. Worcestershire sauce. Tartar sauce. Barbecue sauce. Teriyaki sauce. Soy sauce, including reduced sodium. Steak sauce. Fish sauce. Oyster sauce. Cocktail sauce. Horseradish. Ketchup and mustard. Meat flavorings and tenderizers. Bouillon cubes. Hot sauce. Tabasco sauce. Marinades. Taco seasonings. Relishes. Fats and Oils  Butter, stick margarine, lard, shortening, ghee, and bacon fat. Coconut, palm kernel, or palm oils. Regular salad dressings. Other  Pickles and olives. Salted popcorn and pretzels. The items listed above may not be a complete list of foods and beverages to avoid. Contact your dietitian for more information.  Where can I find more information? National Heart, Lung, and Blood Institute: [employer] This information is not intended to replace advice given to you by your health care provider. Make sure you discuss any questions you have with your health care provider. Document Released: 12/19/2010 Document Revised: 06/07/2015 Document Reviewed: 11/03/2012 Elsevier Interactive Patient Education  2017 [employer].     If you need a refill on your cardiac medications before your next appointment, please call your pharmacy.

## 2016-06-26 ENCOUNTER — Ambulatory Visit: Payer: BLUE CROSS/BLUE SHIELD | Admitting: Internal Medicine

## 2016-06-26 ENCOUNTER — Other Ambulatory Visit: Payer: Self-pay | Admitting: Neurosurgery

## 2016-06-26 DIAGNOSIS — M5417 Radiculopathy, lumbosacral region: Secondary | ICD-10-CM

## 2016-06-30 ENCOUNTER — Encounter: Payer: Self-pay | Admitting: Internal Medicine

## 2016-06-30 ENCOUNTER — Ambulatory Visit (INDEPENDENT_AMBULATORY_CARE_PROVIDER_SITE_OTHER): Payer: BLUE CROSS/BLUE SHIELD | Admitting: Internal Medicine

## 2016-06-30 ENCOUNTER — Encounter (INDEPENDENT_AMBULATORY_CARE_PROVIDER_SITE_OTHER): Payer: Self-pay

## 2016-06-30 VITALS — BP 134/88 | HR 102 | Ht 78.0 in | Wt 285.8 lb

## 2016-06-30 DIAGNOSIS — R600 Localized edema: Secondary | ICD-10-CM | POA: Diagnosis not present

## 2016-06-30 DIAGNOSIS — I10 Essential (primary) hypertension: Secondary | ICD-10-CM | POA: Diagnosis not present

## 2016-06-30 DIAGNOSIS — M79605 Pain in left leg: Secondary | ICD-10-CM

## 2016-06-30 DIAGNOSIS — M5441 Lumbago with sciatica, right side: Secondary | ICD-10-CM

## 2016-06-30 DIAGNOSIS — G8929 Other chronic pain: Secondary | ICD-10-CM | POA: Insufficient documentation

## 2016-06-30 DIAGNOSIS — M5442 Lumbago with sciatica, left side: Secondary | ICD-10-CM | POA: Diagnosis not present

## 2016-06-30 DIAGNOSIS — M79604 Pain in right leg: Secondary | ICD-10-CM

## 2016-06-30 NOTE — Progress Notes (Signed)
Follow-up Outpatient Visit Date: 06/30/2016  Chief Complaint: Follow-up leg swelling  HPI:  Mr. Tyler Wood is a 51 y.o. year-old male with history of hypertension, type 2 diabetes mellitus, GERD, obstructive sleep apnea on CPAP, tobacco use, and low back pain status post lumbar fusion and spinal cord stimulator, who presents for follow-up of leg swelling. Last saw him on 02/25/16, at which time he complained of persistent leg edema, though he was no longer having episodes during which his legs with "blow up." He remained relatively sedentary due to long-standing back pain. Today, he notes that his leg swelling has been much improved. He notices significant swelling only when he "does to much," such as mowing his lawn without taking a break. He has burning pain in both feet as well as weakness of the right ankle following his back surgeries and ankle fracture. He is wearing a brace at this time. He reports being compliant with his medications, as well as CPAP. He notes mild fatigue at times and is concerned about whether this could be due to carvedilol. He denies chest pain, palpitations, lightheadedness, shortness of breath, orthopnea, and PND. He has lost 21 pounds since February, which he attributes to increased activity and dietary changes. He does not wear compression stockings regularly, though he has notices some improvement in leg swelling when he uses them. He is schedule for a myelogram later this week.  --------------------------------------------------------------------------------------------------  Cardiovascular History & Procedures: Cardiovascular Problems:  Chronic lower extremity edema  Risk Factors:  Diabetes, hypertension, tobacco use, and male gender  Cath/PCI:  None.  CV Surgery:  None.  EP Procedures and Devices:  None.  Non-Invasive Evaluation(s):  Bilateral lower extremity venous duplex (11/14/15): No evidence of lower extremity DVT or venous  incompetence.  Transthoracic echocardiogram (10/30/15): Normal LV size with mild LVH. LVEF 55-60%. Grade 1 diastolic dysfunction. Mitral annular calcification with trace MR. Trace TR.  Recent CV Pertinent Labs: Lab Results  Component Value Date   INR 1.21 03/01/2014   K 4.4 10/26/2015   BUN 5 (L) 10/26/2015   CREATININE 0.94 10/26/2015   Outside lipid panel (04/24/14): Total cholesterol 205, triglycerides 339, HDL 35, LDL 102  Past medical and surgical history were reviewed and updated in EPIC.   Outpatient Encounter Prescriptions as of 06/30/2016  Medication Sig  . carvedilol (COREG) 6.25 MG tablet Take 1 tablet (6.25 mg total) by mouth 2 (two) times daily.  . diazepam (VALIUM) 10 MG tablet Take 10 mg by mouth every 8 (eight) hours as needed (for muscle spasms).   . furosemide (LASIX) 20 MG tablet TAKE 1 TABLET(20 MG) BY MOUTH DAILY  . lisinopril-hydrochlorothiazide (PRINZIDE,ZESTORETIC) 20-12.5 MG tablet Take 1 tablet by mouth daily.  Marland Kitchen. LYRICA 150 MG capsule Take 150 mg by mouth 3 (three) times daily.  . metFORMIN (GLUCOPHAGE) 500 MG tablet Take 500 mg by mouth 2 (two) times daily.  . Multiple Vitamins-Minerals (MULTIVITAMIN WITH MINERALS) tablet Take 1 tablet by mouth daily.  . Omega-3 Fatty Acids (FISH OIL) 1000 MG CAPS Take 1,000 mg by mouth daily.  Marland Kitchen. omeprazole (PRILOSEC) 20 MG capsule Take 20 mg by mouth daily.  Marland Kitchen. oxycodone (ROXICODONE) 30 MG immediate release tablet Take 30 mg by mouth every 4 (four) hours as needed for pain.    No facility-administered encounter medications on file as of 06/30/2016.     Allergies: Patient has no known allergies.  Social History   Social History  . Marital status: Married    Spouse name: N/A  .  Number of children: N/A  . Years of education: N/A   Occupational History  . Not on file.   Social History Main Topics  . Smoking status: Current Every Day Smoker    Packs/day: 1.50    Years: 25.00  . Smokeless tobacco: Never Used  .  Alcohol use 8.4 oz/week    14 Cans of beer per week  . Drug use: No  . Sexual activity: Not on file   Other Topics Concern  . Not on file   Social History Narrative  . No narrative on file    Family History  Problem Relation Age of Onset  . Brain cancer Mother   . Heart attack Father 15       MI and CABG  . Lung cancer Father   . Brain cancer Brother     Review of Systems: Review of Systems  Constitutional: Negative.   HENT: Negative.        Snoring  Respiratory: Negative.   Cardiovascular: Positive for leg swelling.  Gastrointestinal: Negative.   Genitourinary: Negative.   Musculoskeletal: Positive for back pain.  Skin: Positive for rash (Intermittent dark spots noted on both feet.).  Neurological: Positive for tingling (Both feet with burning pain).  Endo/Heme/Allergies: Negative.   Psychiatric/Behavioral: Negative.    --------------------------------------------------------------------------------------------------  Physical Exam: BP 134/88   Pulse (!) 102   Ht 6\' 6"  (1.981 m)   Wt 285 lb 12.8 oz (129.6 kg)   SpO2 96%   BMI 33.03 kg/m   General:  Obese man, seated comfortably in the exam room. He has an antalgic gait and ambulates with a cane. HEENT: No conjunctival pallor or scleral icterus.  Moist mucous membranes.  OP clear. Neck: Supple without lymphadenopathy, thyromegaly, JVD, or HJR.  No carotid bruit. Lungs: Normal work of breathing.  Clear to auscultation bilaterally without wheezes or crackles. Heart: Regular rate and rhythm without murmurs, rubs, or gallops.  Non-displaced PMI. Abd: Bowel sounds present.  Soft, NT/ND without hepatosplenomegaly Ext: Trace pretibial edema. Right ankle brace in place. Radial, PT, and DP pulses are 2+ bilaterally. Skin: Warm and dry without rash.   Lab Results  Component Value Date   WBC 9.1 10/26/2015   HGB 15.4 10/26/2015   HCT 44.3 10/26/2015   MCV 92.7 10/26/2015   PLT 216 10/26/2015    Lab Results   Component Value Date   NA 140 10/26/2015   K 4.4 10/26/2015   CL 104 10/26/2015   CO2 26 10/26/2015   BUN 5 (L) 10/26/2015   CREATININE 0.94 10/26/2015   GLUCOSE 82 10/26/2015   ALT 94 (H) 10/26/2015   --------------------------------------------------------------------------------------------------  ASSESSMENT AND PLAN: Lower extremity edema: Swelling has improved since last visit. I have encouraged continued sodium restriction and elevation of the legs when possible.  Hypertension: Blood pressure remains borderline elevated today with continued borderline tachycardia. I suspect that continued back and leg pain is contributing to this. We discussed escalation of his current medications but have agreed to continue his current regimen.  Back and leg pain This has been a chronic problem for him. Symptoms are most suggestive of a neuropathic process. His pedal pulses are normal on exam today. We have agreed to defer ABI's at this time pending further neurosurgical evaluation.  Follow-up: Return to clinic in 6 months.  Yvonne Kendall, MD 06/30/2016 7:38 AM

## 2016-06-30 NOTE — Patient Instructions (Signed)
Medication Instructions:  Your physician recommends that you continue on your current medications as directed. Please refer to the Current Medication list given to you today.   Labwork: None   Testing/Procedures: None   Follow-Up: Your physician wants you to follow-up in: 6 months with Dr End. (December 2018).  You will receive a reminder letter in the mail two months in advance. If you don't receive a letter, please call our office to schedule the follow-up appointment.        If you need a refill on your cardiac medications before your next appointment, please call your pharmacy.   

## 2016-07-03 ENCOUNTER — Ambulatory Visit
Admission: RE | Admit: 2016-07-03 | Discharge: 2016-07-03 | Disposition: A | Discharge status: Home / Self Care | Payer: BLUE CROSS/BLUE SHIELD | Source: Ambulatory Visit | Attending: Neurosurgery | Admitting: Neurosurgery

## 2016-07-03 VITALS — BP 133/77 | HR 93

## 2016-07-03 DIAGNOSIS — M5136 Other intervertebral disc degeneration, lumbar region: Secondary | ICD-10-CM

## 2016-07-03 DIAGNOSIS — M5417 Radiculopathy, lumbosacral region: Secondary | ICD-10-CM

## 2016-07-03 MED ORDER — DIAZEPAM 5 MG PO TABS
10.0000 mg | ORAL_TABLET | Freq: Once | ORAL | Status: AC
  Administered 2016-07-03: 10 mg via ORAL

## 2016-07-03 MED ORDER — ONDANSETRON HCL 4 MG/2ML IJ SOLN: 4.0000 mg | Freq: Four times a day (QID) | INTRAMUSCULAR | Status: DC | PRN

## 2016-07-03 MED ORDER — IOPAMIDOL (ISOVUE-M 200) INJECTION 41%
15.0000 mL | Freq: Once | INTRAMUSCULAR | Status: AC
  Administered 2016-07-03: 15 mL via INTRATHECAL

## 2016-07-03 MED ORDER — HYDROMORPHONE HCL 2 MG/ML IJ SOLN
2.0000 mg | Freq: Once | INTRAMUSCULAR | Status: AC
  Administered 2016-07-03: 2 mg via INTRAMUSCULAR

## 2016-07-03 MED ORDER — HYDROXYZINE HCL 50 MG/ML IM SOLN
25.0000 mg | Freq: Once | INTRAMUSCULAR | Status: AC
  Administered 2016-07-03: 25 mg via INTRAMUSCULAR

## 2016-07-03 NOTE — Discharge Instructions (Signed)

## 2016-08-04 DIAGNOSIS — L97512 Non-pressure chronic ulcer of other part of right foot with fat layer exposed: Secondary | ICD-10-CM | POA: Insufficient documentation

## 2016-08-04 DIAGNOSIS — E11621 Type 2 diabetes mellitus with foot ulcer: Secondary | ICD-10-CM | POA: Insufficient documentation

## 2016-11-19 ENCOUNTER — Other Ambulatory Visit: Payer: Self-pay | Admitting: Orthopedic Surgery

## 2016-12-15 ENCOUNTER — Encounter (HOSPITAL_BASED_OUTPATIENT_CLINIC_OR_DEPARTMENT_OTHER): Payer: Self-pay | Admitting: *Deleted

## 2016-12-15 ENCOUNTER — Other Ambulatory Visit: Payer: Self-pay

## 2016-12-16 ENCOUNTER — Encounter (HOSPITAL_BASED_OUTPATIENT_CLINIC_OR_DEPARTMENT_OTHER)
Admission: RE | Admit: 2016-12-16 | Discharge: 2016-12-16 | Disposition: A | Discharge status: Home / Self Care | Payer: BLUE CROSS/BLUE SHIELD | Source: Ambulatory Visit | Attending: Orthopedic Surgery | Admitting: Orthopedic Surgery

## 2016-12-16 ENCOUNTER — Other Ambulatory Visit: Payer: Self-pay

## 2016-12-16 DIAGNOSIS — Z79899 Other long term (current) drug therapy: Secondary | ICD-10-CM | POA: Diagnosis not present

## 2016-12-16 DIAGNOSIS — K219 Gastro-esophageal reflux disease without esophagitis: Secondary | ICD-10-CM | POA: Diagnosis not present

## 2016-12-16 DIAGNOSIS — E11621 Type 2 diabetes mellitus with foot ulcer: Secondary | ICD-10-CM | POA: Diagnosis not present

## 2016-12-16 DIAGNOSIS — F17200 Nicotine dependence, unspecified, uncomplicated: Secondary | ICD-10-CM | POA: Diagnosis not present

## 2016-12-16 DIAGNOSIS — Z7984 Long term (current) use of oral hypoglycemic drugs: Secondary | ICD-10-CM | POA: Diagnosis not present

## 2016-12-16 DIAGNOSIS — G4730 Sleep apnea, unspecified: Secondary | ICD-10-CM | POA: Diagnosis not present

## 2016-12-16 DIAGNOSIS — L97519 Non-pressure chronic ulcer of other part of right foot with unspecified severity: Secondary | ICD-10-CM | POA: Diagnosis not present

## 2016-12-16 DIAGNOSIS — M2021 Hallux rigidus, right foot: Secondary | ICD-10-CM | POA: Diagnosis present

## 2016-12-16 DIAGNOSIS — I10 Essential (primary) hypertension: Secondary | ICD-10-CM | POA: Diagnosis not present

## 2016-12-16 LAB — BASIC METABOLIC PANEL
ANION GAP: 13 (ref 5–15)
BUN: 6 mg/dL (ref 6–20)
CHLORIDE: 98 mmol/L — AB (ref 101–111)
CO2: 24 mmol/L (ref 22–32)
Calcium: 9.2 mg/dL (ref 8.9–10.3)
Creatinine, Ser: 0.77 mg/dL (ref 0.61–1.24)
GFR calc non Af Amer: >60 mL/min (ref >60)
GLUCOSE: 172 mg/dL — AB (ref 65–99)
POTASSIUM: 4.6 mmol/L (ref 3.5–5.1)
Sodium: 135 mmol/L (ref 135–145)

## 2016-12-16 NOTE — Progress Notes (Signed)
EKG viewed by DR Marcene Duosobert Fitzgerald prior to Pt. Leaving PAT area. Instructed pt. To bring CPAP machine DOS.

## 2016-12-18 ENCOUNTER — Ambulatory Visit (HOSPITAL_BASED_OUTPATIENT_CLINIC_OR_DEPARTMENT_OTHER)
Admission: RE | Admit: 2016-12-18 | Discharge: 2016-12-18 | Disposition: A | Discharge status: Home / Self Care | Payer: BLUE CROSS/BLUE SHIELD | Source: Ambulatory Visit | Attending: Orthopedic Surgery | Admitting: Orthopedic Surgery

## 2016-12-18 ENCOUNTER — Encounter (HOSPITAL_BASED_OUTPATIENT_CLINIC_OR_DEPARTMENT_OTHER): Payer: Self-pay | Admitting: *Deleted

## 2016-12-18 ENCOUNTER — Ambulatory Visit (HOSPITAL_BASED_OUTPATIENT_CLINIC_OR_DEPARTMENT_OTHER): Payer: BLUE CROSS/BLUE SHIELD | Admitting: Certified Registered"

## 2016-12-18 ENCOUNTER — Encounter (HOSPITAL_BASED_OUTPATIENT_CLINIC_OR_DEPARTMENT_OTHER)
Admission: RE | Disposition: A | Discharge status: Home / Self Care | Payer: Self-pay | Source: Ambulatory Visit | Attending: Orthopedic Surgery

## 2016-12-18 ENCOUNTER — Other Ambulatory Visit: Payer: Self-pay

## 2016-12-18 DIAGNOSIS — L97519 Non-pressure chronic ulcer of other part of right foot with unspecified severity: Secondary | ICD-10-CM | POA: Insufficient documentation

## 2016-12-18 DIAGNOSIS — K219 Gastro-esophageal reflux disease without esophagitis: Secondary | ICD-10-CM | POA: Insufficient documentation

## 2016-12-18 DIAGNOSIS — M2021 Hallux rigidus, right foot: Secondary | ICD-10-CM | POA: Diagnosis not present

## 2016-12-18 DIAGNOSIS — Z7984 Long term (current) use of oral hypoglycemic drugs: Secondary | ICD-10-CM | POA: Insufficient documentation

## 2016-12-18 DIAGNOSIS — E11621 Type 2 diabetes mellitus with foot ulcer: Secondary | ICD-10-CM | POA: Insufficient documentation

## 2016-12-18 DIAGNOSIS — G4730 Sleep apnea, unspecified: Secondary | ICD-10-CM | POA: Insufficient documentation

## 2016-12-18 DIAGNOSIS — F17200 Nicotine dependence, unspecified, uncomplicated: Secondary | ICD-10-CM | POA: Insufficient documentation

## 2016-12-18 DIAGNOSIS — I10 Essential (primary) hypertension: Secondary | ICD-10-CM | POA: Insufficient documentation

## 2016-12-18 DIAGNOSIS — Z9889 Other specified postprocedural states: Secondary | ICD-10-CM

## 2016-12-18 DIAGNOSIS — Z79899 Other long term (current) drug therapy: Secondary | ICD-10-CM | POA: Insufficient documentation

## 2016-12-18 HISTORY — PX: CHEILECTOMY: SHX1336

## 2016-12-18 HISTORY — PX: BONE BIOPSY: SHX375

## 2016-12-18 LAB — GLUCOSE, CAPILLARY
GLUCOSE-CAPILLARY: 121 mg/dL — AB (ref 65–99)
Glucose-Capillary: 116 mg/dL — ABNORMAL HIGH (ref 65–99)

## 2016-12-18 SURGERY — CHEILECTOMY
Anesthesia: General | Site: Foot | Laterality: Right

## 2016-12-18 MED ORDER — LIDOCAINE HCL (CARDIAC) 20 MG/ML IV SOLN
INTRAVENOUS | Status: DC | PRN
  Administered 2016-12-18: 60 mg via INTRAVENOUS

## 2016-12-18 MED ORDER — PHENYLEPHRINE HCL 10 MG/ML IJ SOLN
INTRAMUSCULAR | Status: DC | PRN
  Administered 2016-12-18: 80 ug via INTRAVENOUS

## 2016-12-18 MED ORDER — SCOPOLAMINE 1 MG/3DAYS TD PT72: 1.0000 | MEDICATED_PATCH | Freq: Once | TRANSDERMAL | Status: DC | PRN

## 2016-12-18 MED ORDER — MIDAZOLAM HCL 2 MG/2ML IJ SOLN
INTRAMUSCULAR | Status: AC
  Filled 2016-12-18: qty 2

## 2016-12-18 MED ORDER — VANCOMYCIN HCL 500 MG IV SOLR
INTRAVENOUS | Status: DC | PRN
  Administered 2016-12-18: 500 mg via TOPICAL

## 2016-12-18 MED ORDER — FENTANYL CITRATE (PF) 100 MCG/2ML IJ SOLN: 25.0000 ug | INTRAMUSCULAR | Status: DC | PRN

## 2016-12-18 MED ORDER — 0.9 % SODIUM CHLORIDE (POUR BTL) OPTIME
TOPICAL | Status: DC | PRN
  Administered 2016-12-18: 250 mL

## 2016-12-18 MED ORDER — ONDANSETRON HCL 4 MG/2ML IJ SOLN
INTRAMUSCULAR | Status: DC | PRN
  Administered 2016-12-18: 4 mg via INTRAVENOUS

## 2016-12-18 MED ORDER — OXYCODONE HCL 5 MG PO TABS
ORAL_TABLET | ORAL | Status: AC
  Filled 2016-12-18: qty 1

## 2016-12-18 MED ORDER — PROPOFOL 10 MG/ML IV BOLUS
INTRAVENOUS | Status: DC | PRN
  Administered 2016-12-18: 200 mg via INTRAVENOUS

## 2016-12-18 MED ORDER — FENTANYL CITRATE (PF) 100 MCG/2ML IJ SOLN
INTRAMUSCULAR | Status: AC
  Filled 2016-12-18: qty 2

## 2016-12-18 MED ORDER — CEFAZOLIN SODIUM-DEXTROSE 2-4 GM/100ML-% IV SOLN: 2.0000 g | INTRAVENOUS | Status: DC

## 2016-12-18 MED ORDER — MIDAZOLAM HCL 2 MG/2ML IJ SOLN
1.0000 mg | INTRAMUSCULAR | Status: DC | PRN
  Administered 2016-12-18: 2 mg via INTRAVENOUS

## 2016-12-18 MED ORDER — CHLORHEXIDINE GLUCONATE 4 % EX LIQD: 60.0000 mL | Freq: Once | CUTANEOUS | Status: DC

## 2016-12-18 MED ORDER — DEXAMETHASONE SODIUM PHOSPHATE 10 MG/ML IJ SOLN
INTRAMUSCULAR | Status: DC | PRN
  Administered 2016-12-18: 4 mg via INTRAVENOUS

## 2016-12-18 MED ORDER — CEFAZOLIN SODIUM-DEXTROSE 2-3 GM-%(50ML) IV SOLR
INTRAVENOUS | Status: DC | PRN
  Administered 2016-12-18: 2 g via INTRAVENOUS

## 2016-12-18 MED ORDER — CEFAZOLIN SODIUM-DEXTROSE 2-4 GM/100ML-% IV SOLN
INTRAVENOUS | Status: AC
  Filled 2016-12-18: qty 100

## 2016-12-18 MED ORDER — FENTANYL CITRATE (PF) 100 MCG/2ML IJ SOLN
50.0000 ug | INTRAMUSCULAR | Status: AC | PRN
  Administered 2016-12-18: 100 ug via INTRAVENOUS
  Administered 2016-12-18 (×2): 50 ug via INTRAVENOUS

## 2016-12-18 MED ORDER — BUPIVACAINE-EPINEPHRINE 0.5% -1:200000 IJ SOLN
INTRAMUSCULAR | Status: DC | PRN
  Administered 2016-12-18: 10 mL

## 2016-12-18 MED ORDER — OXYCODONE HCL 5 MG PO TABS
5.0000 mg | ORAL_TABLET | Freq: Once | ORAL | Status: AC | PRN
  Administered 2016-12-18: 5 mg via ORAL

## 2016-12-18 MED ORDER — PROMETHAZINE HCL 25 MG/ML IJ SOLN: 6.2500 mg | INTRAMUSCULAR | Status: DC | PRN

## 2016-12-18 MED ORDER — LACTATED RINGERS IV SOLN
INTRAVENOUS | Status: DC
  Administered 2016-12-18 (×2): via INTRAVENOUS

## 2016-12-18 SURGICAL SUPPLY — 65 items
BANDAGE ESMARK 6X9 LF (GAUZE/BANDAGES/DRESSINGS) IMPLANT
BLADE AVERAGE 25MMX9MM (BLADE) ×1
BLADE AVERAGE 25X9 (BLADE) ×3 IMPLANT
BLADE MINI RND TIP GREEN BEAV (BLADE) IMPLANT
BLADE OSC/SAG .038X5.5 CUT EDG (BLADE) IMPLANT
BLADE SURG 15 STRL LF DISP TIS (BLADE) ×4 IMPLANT
BLADE SURG 15 STRL SS (BLADE) ×8
BNDG CMPR 9X4 STRL LF SNTH (GAUZE/BANDAGES/DRESSINGS)
BNDG CMPR 9X6 STRL LF SNTH (GAUZE/BANDAGES/DRESSINGS)
BNDG COHESIVE 4X5 TAN STRL (GAUZE/BANDAGES/DRESSINGS) ×4 IMPLANT
BNDG CONFORM 2 STRL LF (GAUZE/BANDAGES/DRESSINGS) IMPLANT
BNDG CONFORM 3 STRL LF (GAUZE/BANDAGES/DRESSINGS) ×4 IMPLANT
BNDG ESMARK 4X9 LF (GAUZE/BANDAGES/DRESSINGS) IMPLANT
BNDG ESMARK 6X9 LF (GAUZE/BANDAGES/DRESSINGS)
CHLORAPREP W/TINT 26ML (MISCELLANEOUS) ×4 IMPLANT
CONT SPECI 4OZ STER CLIK (MISCELLANEOUS) ×8 IMPLANT
COVER BACK TABLE 60X90IN (DRAPES) ×4 IMPLANT
CUFF TOURNIQUET SINGLE 18IN (TOURNIQUET CUFF) IMPLANT
CUFF TOURNIQUET SINGLE 24IN (TOURNIQUET CUFF) ×4 IMPLANT
DRAPE EXTREMITY T 121X128X90 (DRAPE) ×4 IMPLANT
DRAPE OEC MINIVIEW 54X84 (DRAPES) ×4 IMPLANT
DRAPE SURG 17X23 STRL (DRAPES) IMPLANT
DRAPE U-SHAPE 47X51 STRL (DRAPES) ×4 IMPLANT
DRSG MEPITEL 4X7.2 (GAUZE/BANDAGES/DRESSINGS) ×4 IMPLANT
DRSG PAD ABDOMINAL 8X10 ST (GAUZE/BANDAGES/DRESSINGS) ×4 IMPLANT
ELECT REM PT RETURN 9FT ADLT (ELECTROSURGICAL) ×4
ELECTRODE REM PT RTRN 9FT ADLT (ELECTROSURGICAL) ×2 IMPLANT
GAUZE SPONGE 4X4 12PLY STRL (GAUZE/BANDAGES/DRESSINGS) ×4 IMPLANT
GLOVE BIO SURGEON STRL SZ8 (GLOVE) ×4 IMPLANT
GLOVE BIOGEL PI IND STRL 7.0 (GLOVE) ×4 IMPLANT
GLOVE BIOGEL PI IND STRL 8 (GLOVE) ×4 IMPLANT
GLOVE BIOGEL PI INDICATOR 7.0 (GLOVE) ×4
GLOVE BIOGEL PI INDICATOR 8 (GLOVE) ×4
GLOVE ECLIPSE 6.5 STRL STRAW (GLOVE) ×4 IMPLANT
GLOVE ECLIPSE 8.0 STRL XLNG CF (GLOVE) ×4 IMPLANT
GOWN STRL REUS W/ TWL LRG LVL3 (GOWN DISPOSABLE) ×2 IMPLANT
GOWN STRL REUS W/ TWL XL LVL3 (GOWN DISPOSABLE) ×4 IMPLANT
GOWN STRL REUS W/TWL LRG LVL3 (GOWN DISPOSABLE) ×4
GOWN STRL REUS W/TWL XL LVL3 (GOWN DISPOSABLE) ×6
NEEDLE HYPO 25X1 1.5 SAFETY (NEEDLE) ×4 IMPLANT
NS IRRIG 1000ML POUR BTL (IV SOLUTION) ×4 IMPLANT
PACK BASIN DAY SURGERY FS (CUSTOM PROCEDURE TRAY) ×4 IMPLANT
PAD CAST 4YDX4 CTTN HI CHSV (CAST SUPPLIES) ×2 IMPLANT
PADDING CAST COTTON 4X4 STRL (CAST SUPPLIES) ×3
PENCIL BUTTON HOLSTER BLD 10FT (ELECTRODE) ×4 IMPLANT
SANITIZER HAND PURELL 535ML FO (MISCELLANEOUS) ×4 IMPLANT
SHEET MEDIUM DRAPE 40X70 STRL (DRAPES) ×4 IMPLANT
SLEEVE SCD COMPRESS KNEE MED (MISCELLANEOUS) ×4 IMPLANT
SPONGE LAP 18X18 X RAY DECT (DISPOSABLE) ×4 IMPLANT
STOCKINETTE 6  STRL (DRAPES) ×2
STOCKINETTE 6 STRL (DRAPES) ×2 IMPLANT
SUCTION FRAZIER HANDLE 10FR (MISCELLANEOUS)
SUCTION TUBE FRAZIER 10FR DISP (MISCELLANEOUS) IMPLANT
SUT ETHILON 3 0 PS 1 (SUTURE) ×4 IMPLANT
SUT MNCRL AB 3-0 PS2 18 (SUTURE) ×4 IMPLANT
SUT VIC AB 2-0 SH 27 (SUTURE) ×4
SUT VIC AB 2-0 SH 27XBRD (SUTURE) ×2 IMPLANT
SWAB COLLECTION DEVICE MRSA (MISCELLANEOUS) IMPLANT
SWAB CULTURE ESWAB REG 1ML (MISCELLANEOUS) IMPLANT
SYR BULB 3OZ (MISCELLANEOUS) ×4 IMPLANT
SYR CONTROL 10ML LL (SYRINGE) ×4 IMPLANT
TOWEL OR 17X24 6PK STRL BLUE (TOWEL DISPOSABLE) ×4 IMPLANT
TUBE CONNECTING 20'X1/4 (TUBING)
TUBE CONNECTING 20X1/4 (TUBING) IMPLANT
UNDERPAD 30X30 (UNDERPADS AND DIAPERS) ×4 IMPLANT

## 2016-12-18 NOTE — Anesthesia Postprocedure Evaluation (Signed)
Anesthesia Post Note  Patient: Tyler Wood  Procedure(s) Performed: Right Hallux Metatarsophalangeal Joint Cheilectomy (Right Ankle) Bone Biopsy and Cultures (Right Foot)     Patient location during evaluation: PACU Anesthesia Type: General Level of consciousness: awake and alert Pain management: pain level controlled Vital Signs Assessment: post-procedure vital signs reviewed and stable Respiratory status: spontaneous breathing, nonlabored ventilation and respiratory function stable Cardiovascular status: blood pressure returned to baseline and stable Postop Assessment: no apparent nausea or vomiting Anesthetic complications: no    Last Vitals:  Vitals:   12/18/16 1015 12/18/16 1045  BP: 103/65 106/74  Pulse: (!) 103 98  Resp: 16 18  Temp:  36.6 C  SpO2: 96% 95%    Last Pain:  Vitals:   12/18/16 1045  TempSrc:   PainSc: 0-No pain                 Cecile HearingStephen Edward Turk

## 2016-12-18 NOTE — Anesthesia Procedure Notes (Signed)
Procedure Name: LMA Insertion Date/Time: 12/18/2016 9:03 AM Performed by: Sheryn BisonBlocker, Rosevelt Luu D, CRNA Pre-anesthesia Checklist: Patient identified, Emergency Drugs available, Suction available and Patient being monitored Patient Re-evaluated:Patient Re-evaluated prior to induction Oxygen Delivery Method: Circle system utilized Preoxygenation: Pre-oxygenation with 100% oxygen Induction Type: IV induction Ventilation: Mask ventilation without difficulty LMA: LMA inserted LMA Size: 5.0 Number of attempts: 1 Airway Equipment and Method: Bite block Placement Confirmation: positive ETCO2 Tube secured with: Tape Dental Injury: Teeth and Oropharynx as per pre-operative assessment

## 2016-12-18 NOTE — Discharge Instructions (Addendum)
Toni ArthursJohn Hewitt, MD [facility]  Please read the following information regarding your care after surgery.  Medications  You only need a prescription for the narcotic pain medicine (ex. oxycodone, Percocet, Norco).  All of the other medicines listed below are available over the counter. X Aleve 2 pills twice a day for the first 3 days after surgery. X acetominophen (Tylenol) 650 mg every 4-6 hours as you need for minor to moderate pain X oxycodone that you have at home for severe pain  Narcotic pain medicine (ex. oxycodone, Percocet, Vicodin) will cause constipation.  To prevent this problem, take the following medicines while you are taking any pain medicine. X docusate sodium (Colace) 100 mg twice a day X senna (Senokot) 2 tablets twice a day  Weight Bearing X Bear weight when you are able on your operated leg or foot in the flat post-op shoe.  Cast / Splint / Dressing X Keep your splint, cast or dressing clean and dry.  Dont put anything (coat hanger, pencil, etc) down inside of it.  If it gets damp, use a hair dryer on the cool setting to dry it.  If it gets soaked, call the office to schedule an appointment for a cast/dressing change.   After your dressing, cast or splint is removed; you may shower, but do not soak or scrub the wound.  Allow the water to run over it, and then gently pat it dry.  Swelling It is normal for you to have swelling where you had surgery.  To reduce swelling and pain, keep your toes above your nose for at least 3 days after surgery.  It may be necessary to keep your foot or leg elevated for several weeks.  If it hurts, it should be elevated.  Follow Up Call my office at [phone] when you are discharged from the hospital or surgery center to schedule an appointment to be seen two weeks after surgery.  Call my office at [phone] if you develop a fever >101.5 F, nausea, vomiting, bleeding from the surgical site or severe pain.     Post  Anesthesia Home Care Instructions  Activity: Get plenty of rest for the remainder of the day. A responsible individual must stay with you for 24 hours following the procedure.  For the next 24 hours, DO NOT: -Drive a car -Advertising copywriterperate machinery -Drink alcoholic beverages -Take any medication unless instructed by your physician -Make any legal decisions or sign important papers.  Meals: Start with liquid foods such as gelatin or soup. Progress to regular foods as tolerated. Avoid greasy, spicy, heavy foods. If nausea and/or vomiting occur, drink only clear liquids until the nausea and/or vomiting subsides. Call your physician if vomiting continues.  Special Instructions/Symptoms: Your throat may feel dry or sore from the anesthesia or the breathing tube placed in your throat during surgery. If this causes discomfort, gargle with warm salt water. The discomfort should disappear within 24 hours.  If you had a scopolamine patch placed behind your ear for the management of post- operative nausea and/or vomiting:  1. The medication in the patch is effective for 72 hours, after which it should be removed.  Wrap patch in a tissue and discard in the trash. Wash hands thoroughly with soap and water. 2. You may remove the patch earlier than 72 hours if you experience unpleasant side effects which may include dry mouth, dizziness or visual disturbances. 3. Avoid touching the patch. Wash your hands with soap and water after contact with the  patch. °  ° ° °

## 2016-12-18 NOTE — Op Note (Signed)
12/18/2016  10:03 AM  PATIENT:  Tyler Wood  51 y.o. male  PRE-OPERATIVE DIAGNOSIS:  Right hallux rigidus; right foot Diabetic Ulcer  POST-OPERATIVE DIAGNOSIS:  Same  Procedure(s): 1.  Right Hallux Metatarsophalangeal Joint Cheilectomy 2.  Excision of right foot diabetic ulcer  SURGEON:  Toni ArthursJohn Krysteena Stalker, MD  ASSISTANT: Alfredo MartinezJustin Ollis, PA-C  ANESTHESIA:   General  EBL:  minimal   TOURNIQUET:   Total Tourniquet Time Documented: Calf (Right) - 23 minutes Total: Calf (Right) - 23 minutes  COMPLICATIONS:  None apparent  DISPOSITION:  Extubated, awake and stable to recovery.  INDICATION FOR PROCEDURE: The patient is a 51 year old male with past medical history significant for diabetes and chronic pain.  He has developed an ulcer over the dorsum of his right foot at the hallux MP joint.  He also has pain from hallux rigidus at this same location.  He has failed nonoperative treatment to date and presents today for surgical treatment of these conditions.  He understands the risks and benefits of the alternative treatment options and elects surgical treatment.  He is physically understands risks of bleeding, infection, nerve damage, blood clots, continued pain, amputation and death.  PROCEDURE IN DETAIL:  After pre operative consent was obtained, and the correct operative site was identified, the patient was brought to the operating room and placed supine on the OR table.  Anesthesia was administered.  Pre-operative antibiotics were held.  A surgical timeout was taken.  The right lower extremity was then prepped and draped in standard sterile fashion with a tourniquet around the calf.  The extremity was exsanguinated and the tourniquet was inflated to 200 mmHg.  The dorsal ulcer was identified over the first metatarsal head.  An ellipsoid incision was marked on the skin around the lesion.  The incision was made and extended proximally and distally.  The ulcer was excised in its entirety.  The  extensor hallucis longus and brevis tendons were identified.  They were protected and retracted throughout the case.  The dorsal cord joint capsule was incised and elevated medially and laterally.  Soft tissue was obtained from the synovium and sent as a specimen to microbiology.  The patient was noted to have loose bodies throughout the dorsal aspect of the hallux MP joint.  These were removed with a rongeur and sent as a specimen to pathology.  Collateral ligaments were released and the metatarsal head was exposed.  There is considerable degenerative change but no evidence of any osteomyelitis.  The osteophytes around the metatarsal head were then resected with oscillating saw and a rongeur.  Osteophytes at the base of the proximal phalanx were removed with a rongeur.  Wound was irrigated copiously.  Antibiotics were administered.  The hallux could then be dorsiflex approximate 30 degrees and plantar flex 30 degrees passively.  Joint capsule was repaired with simple sutures of 2-0 Vicryl.  Subtenons tissues were approximated with inverted simple sutures of 3-0 Monocryl.  Skin incision was closed with a running 3-0 nylon.  Vancomycin powder was sprinkled in the wound prior to joint capsule closure and then prior to skin closure.  Simultaneous tissues were anesthetized for postoperative pain control.  Sterile dressings were applied followed by a compression wrap.  Tourniquet was released after application of the dressings.  The patient was awakened from anesthesia and transported to the recovery room in stable condition.  FOLLOW UP PLAN: Weightbearing as tolerated in a flat postop shoe.  Follow-up in 2 weeks for path and micro results.  Plan to begin weightbearing in regular shoe at the next visit if the wound is amenable.  Plan to work on range of motion starting at that time as well.    Alfredo MartinezJustin Ollis PA-C was present and scrubbed for the duration of the operative case. His assistance assistance was essential  in positioning the patient, prepping and draping, gaining maintaining exposure, performing the operation, closing and dressing the wounds and applying the splint.

## 2016-12-18 NOTE — Transfer of Care (Signed)
Immediate Anesthesia Transfer of Care Note  Patient: Tyler Wood  Procedure(s) Performed: Right Hallux Metatarsophalangeal Joint Cheilectomy (Right Ankle) Bone Biopsy and Cultures (Right Foot)  Patient Location: PACU  Anesthesia Type:General  Level of Consciousness: awake, alert , oriented and patient cooperative  Airway & Oxygen Therapy: Patient Spontanous Breathing and Patient connected to face mask oxygen  Post-op Assessment: Report given to RN and Post -op Vital signs reviewed and stable  Post vital signs: Reviewed and stable  Last Vitals:  Vitals:   12/18/16 0800 12/18/16 0815  BP: 135/88 121/81  Pulse: 100 (!) 103  Resp: 14 15  Temp:    SpO2: 96% 94%    Last Pain:  Vitals:   12/18/16 0739  TempSrc: Oral  PainSc: 7       Patients Stated Pain Goal: 5 (12/18/16 0739)  Complications: No apparent anesthesia complications

## 2016-12-18 NOTE — Anesthesia Preprocedure Evaluation (Addendum)
Anesthesia Evaluation  Patient identified by MRN, date of birth, ID band Patient awake    Reviewed: Allergy & Precautions, NPO status , Patient's Chart, lab work & pertinent test results, reviewed documented beta blocker date and time   Airway Mallampati: III  TM Distance: <3 FB Neck ROM: Full    Dental  (+) Teeth Intact, Dental Advisory Given   Pulmonary sleep apnea and Continuous Positive Airway Pressure Ventilation , Current Smoker,    Pulmonary exam normal breath sounds clear to auscultation       Cardiovascular hypertension, Pt. on home beta blockers and Pt. on medications Normal cardiovascular exam Rhythm:Regular Rate:Normal     Neuro/Psych  Neuromuscular disease negative psych ROS   GI/Hepatic Neg liver ROS, GERD  Medicated,  Endo/Other  diabetes, Type 2, Oral Hypoglycemic AgentsObesity   Renal/GU negative Renal ROS     Musculoskeletal  (+) Arthritis ,   Abdominal   Peds  Hematology negative hematology ROS (+)   Anesthesia Other Findings Day of surgery medications reviewed with the patient.  Reproductive/Obstetrics                            Anesthesia Physical Anesthesia Plan  ASA: II  Anesthesia Plan: General   Post-op Pain Management:    Induction: Intravenous  PONV Risk Score and Plan: 1 and Dexamethasone and Ondansetron  Airway Management Planned: LMA  Additional Equipment:   Intra-op Plan:   Post-operative Plan: Extubation in OR  Informed Consent: I have reviewed the patients History and Physical, chart, labs and discussed the procedure including the risks, benefits and alternatives for the proposed anesthesia with the patient or authorized representative who has indicated his/her understanding and acceptance.   Dental advisory given  Plan Discussed with: CRNA  Anesthesia Plan Comments: (Risks/benefits of general anesthesia discussed with patient including risk  of damage to teeth, lips, gum, and tongue, nausea/vomiting, allergic reactions to medications, and the possibility of heart attack, stroke and death.  All patient questions answered.  Patient wishes to proceed.)        Anesthesia Quick Evaluation

## 2016-12-18 NOTE — H&P (Signed)
Tyler Wood is an 51 y.o. male.   Chief Complaint: right foot painful ulcer HPI:  51 y/o male with PMH of DM and chronic pain c/o a painful ulcer at the right hallux MPJ for several months.  He has pain with WB and shoewear.  He has failed non op treatment and presents today for cheilectomy and biopsy of the 1st MT.  We will hold abx pending the cultures.  Past Medical History:  Diagnosis Date  . Arthritis    back  . Diabetes mellitus without complication ([place])   . GERD (gastroesophageal reflux disease)   . Hypertension   . Sleep apnea    Uses CPAP    Past Surgical History:  Procedure Laterality Date  . BACK SURGERY     Fused on Feb. 17, 2016  . FORAMINOTOMY 1 LEVEL  01/12/2015   L5 S1  . LUMBAR LAMINECTOMY/DECOMPRESSION MICRODISCECTOMY Right 01/12/2015   Procedure: right Lumbar five -sacral one Foraminotomy;  Surgeon: Leeroy Cha, MD;  Location: [facility];  Service: Neurosurgery;  Laterality: Right;  . SPINAL CORD STIMULATOR INSERTION N/A 07/20/2015   Procedure: LUMBAR SPINAL CORD STIMULATOR INSERTION;  Surgeon: Clydell Hakim, MD;  Location: [facility];  Service: Neurosurgery;  Laterality: N/A;  Lumbar/thoracic spine    Family History  Problem Relation Age of Onset  . Brain cancer Mother   . Heart attack Father 72       MI and CABG  . Lung cancer Father   . Brain cancer Brother    Social History:  reports that he has been smoking.  He has a 12.50 pack-year smoking history. he has never used smokeless tobacco. He reports that he drinks about 1.2 oz of alcohol per week. He reports that he does not use drugs.  Allergies: No Known Allergies  Medications Prior to Admission  Medication Sig Dispense Refill  . atorvastatin (LIPITOR) 20 MG tablet Take 20 mg by mouth daily.    . carvedilol (COREG) 6.25 MG tablet Take 1 tablet (6.25 mg total) by mouth 2 (two) times daily. 180 tablet 3  . diazepam (VALIUM) 10 MG tablet Take 10 mg by mouth every 8 (eight) hours as needed (for  muscle spasms).   2  . furosemide (LASIX) 20 MG tablet TAKE 1 TABLET(20 MG) BY MOUTH DAILY    . lisinopril-hydrochlorothiazide (PRINZIDE,ZESTORETIC) 20-12.5 MG tablet Take 1 tablet by mouth daily.  4  . LYRICA 150 MG capsule Take 150 mg by mouth 3 (three) times daily.  1  . metFORMIN (GLUCOPHAGE) 500 MG tablet Take 500 mg by mouth 2 (two) times daily.  2  . Multiple Vitamins-Minerals (MULTIVITAMIN WITH MINERALS) tablet Take 1 tablet by mouth daily.    . nortriptyline (PAMELOR) 25 MG capsule Take 25 mg by mouth at bedtime.    . Omega-3 Fatty Acids (FISH OIL) 1000 MG CAPS Take 1,000 mg by mouth daily.    Marland Kitchen omeprazole (PRILOSEC) 20 MG capsule Take 20 mg by mouth daily.    Marland Kitchen oxycodone (ROXICODONE) 30 MG immediate release tablet Take 30 mg by mouth every 4 (four) hours as needed for pain.       Results for orders placed or performed during the hospital encounter of 12/18/16 (from the past 48 hour(s))  Basic metabolic panel     Status: Abnormal   Collection Time: 12/16/16  1:00 PM  Result Value Ref Range   Sodium 135 135 - 145 mmol/L   Potassium 4.6 3.5 - 5.1 mmol/L  Chloride 98 (L) 101 - 111 mmol/L   CO2 24 22 - 32 mmol/L   Glucose, Bld 172 (H) 65 - 99 mg/dL   BUN 6 6 - 20 mg/dL   Creatinine, Ser 0.77 0.61 - 1.24 mg/dL   Calcium 9.2 8.9 - 10.3 mg/dL   GFR calc non Af Amer >60 >60 mL/min   GFR calc Af Amer >60 >60 mL/min    Comment: (NOTE) The eGFR has been calculated using the CKD EPI equation. This calculation has not been validated in all clinical situations. eGFR's persistently <60 mL/min signify possible Chronic Kidney Disease.    Anion gap 13 5 - 15  Glucose, capillary     Status: Abnormal   Collection Time: 01-16-17  7:58 AM  Result Value Ref Range   Glucose-Capillary 116 (H) 65 - 99 mg/dL   No results found.  ROS  No recent f/c/nv//wt loss  Blood pressure 121/81, pulse (!) 103, temperature 98.2 F (36.8 C), temperature source Oral, resp. rate 15, height _0  (1.981 m),  weight 129.5 kg (285 lb 9.6 oz), SpO2 94 %. Physical Exam  wn wd male in nad.  A and o x 4.  Mood and affect normal.  EOMi.  resp unlabored.  R forefoot with dorsal ulcer over the 1st MT head.  No signs of infection.  Skin o/w healthy.  Sens to LT dminisehd a tthe forefoot.  5/5 strength in PF ad DF of the ankle.    Assessment/Plan R hallux rigidus with diabetic ulcer - to OR for surgical treatment.  The risks and benefits of the alternative treatment options have been discussed in detail.  The patient wishes to proceed with surgery and specifically understands risks of bleeding, infection, nerve damage, blood clots, need for additional surgery, amputation and death.   Wylene Simmer, MD 01-16-17, 8:41 AM

## 2016-12-19 ENCOUNTER — Encounter (HOSPITAL_BASED_OUTPATIENT_CLINIC_OR_DEPARTMENT_OTHER): Payer: Self-pay | Admitting: Orthopedic Surgery

## 2016-12-23 LAB — AEROBIC/ANAEROBIC CULTURE W GRAM STAIN (SURGICAL/DEEP WOUND)

## 2016-12-23 LAB — AEROBIC/ANAEROBIC CULTURE (SURGICAL/DEEP WOUND): CULTURE: NO GROWTH

## 2016-12-25 ENCOUNTER — Ambulatory Visit: Payer: BLUE CROSS/BLUE SHIELD | Admitting: Internal Medicine

## 2017-01-02 DIAGNOSIS — T8131XA Disruption of external operation (surgical) wound, not elsewhere classified, initial encounter: Secondary | ICD-10-CM | POA: Insufficient documentation

## 2017-02-14 ENCOUNTER — Other Ambulatory Visit: Payer: Self-pay | Admitting: Internal Medicine

## 2017-02-14 DIAGNOSIS — I10 Essential (primary) hypertension: Secondary | ICD-10-CM

## 2017-02-16 NOTE — Telephone Encounter (Signed)
Please review for a refill. Thanks! 

## 2017-03-02 DIAGNOSIS — Z6831 Body mass index (BMI) 31.0-31.9, adult: Secondary | ICD-10-CM | POA: Diagnosis not present

## 2017-03-02 DIAGNOSIS — G894 Chronic pain syndrome: Secondary | ICD-10-CM | POA: Diagnosis not present

## 2017-03-02 DIAGNOSIS — I10 Essential (primary) hypertension: Secondary | ICD-10-CM | POA: Diagnosis not present

## 2017-03-02 DIAGNOSIS — M961 Postlaminectomy syndrome, not elsewhere classified: Secondary | ICD-10-CM | POA: Diagnosis not present

## 2017-03-02 DIAGNOSIS — M5417 Radiculopathy, lumbosacral region: Secondary | ICD-10-CM | POA: Diagnosis not present

## 2017-08-10 ENCOUNTER — Other Ambulatory Visit: Payer: Self-pay | Admitting: Internal Medicine

## 2017-08-10 DIAGNOSIS — I10 Essential (primary) hypertension: Secondary | ICD-10-CM

## 2017-08-11 ENCOUNTER — Other Ambulatory Visit: Payer: Self-pay | Admitting: Internal Medicine

## 2017-08-11 DIAGNOSIS — I10 Essential (primary) hypertension: Secondary | ICD-10-CM

## 2017-08-11 NOTE — Telephone Encounter (Signed)
Please review for refill, Thanks !  

## 2017-09-09 ENCOUNTER — Other Ambulatory Visit: Payer: Self-pay | Admitting: Internal Medicine

## 2017-09-09 DIAGNOSIS — I10 Essential (primary) hypertension: Secondary | ICD-10-CM

## 2017-09-10 ENCOUNTER — Other Ambulatory Visit: Payer: Self-pay | Admitting: Internal Medicine

## 2017-09-10 DIAGNOSIS — I10 Essential (primary) hypertension: Secondary | ICD-10-CM

## 2017-09-10 NOTE — Telephone Encounter (Signed)
Please review for refill, Thanks !  

## 2017-09-10 NOTE — Telephone Encounter (Signed)
Please review for refill. Thanks!  

## 2017-10-01 ENCOUNTER — Encounter: Payer: Self-pay | Admitting: Physician Assistant

## 2017-10-13 ENCOUNTER — Other Ambulatory Visit: Payer: Self-pay | Admitting: Internal Medicine

## 2017-10-13 DIAGNOSIS — I10 Essential (primary) hypertension: Secondary | ICD-10-CM

## 2017-10-13 NOTE — Telephone Encounter (Signed)
Please review for refill.  

## 2017-10-14 ENCOUNTER — Ambulatory Visit: Payer: Medicare HMO | Admitting: Physician Assistant

## 2017-10-14 ENCOUNTER — Encounter: Payer: Self-pay | Admitting: Physician Assistant

## 2017-10-14 VITALS — BP 122/90 | HR 100 | Ht 78.0 in | Wt 273.0 lb

## 2017-10-14 DIAGNOSIS — Z720 Tobacco use: Secondary | ICD-10-CM | POA: Diagnosis not present

## 2017-10-14 DIAGNOSIS — I5032 Chronic diastolic (congestive) heart failure: Secondary | ICD-10-CM

## 2017-10-14 DIAGNOSIS — I10 Essential (primary) hypertension: Secondary | ICD-10-CM

## 2017-10-14 DIAGNOSIS — R011 Cardiac murmur, unspecified: Secondary | ICD-10-CM | POA: Diagnosis not present

## 2017-10-14 MED ORDER — CARVEDILOL 6.25 MG PO TABS: 6.2500 mg | ORAL_TABLET | Freq: Two times a day (BID) | ORAL | 3 refills | Status: DC

## 2017-10-14 NOTE — Progress Notes (Signed)
Cardiology Office Note    Date:  10/14/2017   ID:  Tyler Wood, DOB 10/14/1965, MRN [id number]  PCP:  Michiel Cowboy, PA-C  Cardiologist:  Dr. Okey Dupre  Chief Complaint: 15  Months follow up  History of Present Illness:   Tyler Wood is a 52 y.o. male  with history of hypertension, type 2 diabetes mellitus, GERD, obstructive sleep apnea on CPAP, tobacco use, and low back pain status post lumbar fusion and spinal cord stimulator presents for follow up.   Transthoracic echocardiogram (10/30/15): Normal LV size with mild LVH. LVEF 55-60%. Grade 1 diastolic dysfunction. Mitral annular calcification with trace MR. Trace TR.  Last seen by Dr. Okey Dupre 06/2016.  Here today for follow up. Continues to smoke 1 pack a day. Not interested in quit, however says working with PCP. Compliant with medications. The patient denies nausea, vomiting, fever, chest pain, palpitations, shortness of breath, orthopnea, PND, dizziness, syncope, cough, congestion, abdominal pain, hematochezia, melena, lower extremity edema. Uses cane for ambulation.   Past Medical History:  Diagnosis Date  . Arthritis    back  . Diabetes mellitus without complication ([facility])   . GERD (gastroesophageal reflux disease)   . Hypertension   . Sleep apnea    Uses CPAP    Past Surgical History:  Procedure Laterality Date  . BACK SURGERY     Fused on Feb. 17, 2016  . BONE BIOPSY Right 12/18/2016   Procedure: Bone Biopsy and Cultures;  Surgeon: Toni Arthurs, MD;  Location: [facility];  Service: Orthopedics;  Laterality: Right;  . CHEILECTOMY Right 12/18/2016   Procedure: Right Hallux Metatarsophalangeal Joint Cheilectomy;  Surgeon: Toni Arthurs, MD;  Location:  SURGERY CENTER;  Service: Orthopedics;  Laterality: Right;  . FORAMINOTOMY 1 LEVEL  01/12/2015   L5 S1  . LUMBAR LAMINECTOMY/DECOMPRESSION MICRODISCECTOMY Right 01/12/2015   Procedure: right Lumbar five -sacral one Foraminotomy;  Surgeon: Hilda Lias,  MD;  Location: [facility];  Service: Neurosurgery;  Laterality: Right;  . SPINAL CORD STIMULATOR INSERTION N/A 07/20/2015   Procedure: LUMBAR SPINAL CORD STIMULATOR INSERTION;  Surgeon: Odette Fraction, MD;  Location: [facility];  Service: Neurosurgery;  Laterality: N/A;  Lumbar/thoracic spine    Current Medications: Prior to Admission medications   Medication Sig Start Date End Date Taking? Authorizing Provider  atorvastatin (LIPITOR) 20 MG tablet Take 20 mg by mouth daily.    [provider]  carvedilol (COREG) 6.25 MG tablet TAKE 1 TABLET(6.25 MG) BY MOUTH TWICE DAILY WITH A MEAL 10/13/17   End, Cristal Deer, MD  diazepam (VALIUM) 10 MG tablet Take 10 mg by mouth every 8 (eight) hours as needed (for muscle spasms).  07/05/15   [provider]  furosemide (LASIX) 20 MG tablet TAKE 1 TABLET(20 MG) BY MOUTH DAILY 06/20/16   [provider]  lisinopril-hydrochlorothiazide (PRINZIDE,ZESTORETIC) 20-12.5 MG tablet Take 1 tablet by mouth daily. 06/29/15   [provider]  LYRICA 150 MG capsule Take 150 mg by mouth 3 (three) times daily. 07/09/15   [provider]  metFORMIN (GLUCOPHAGE) 500 MG tablet Take 500 mg by mouth 2 (two) times daily. 06/29/15   [provider]  Multiple Vitamins-Minerals (MULTIVITAMIN WITH MINERALS) tablet Take 1 tablet by mouth daily.    [provider]  nortriptyline (PAMELOR) 25 MG capsule Take 25 mg by mouth at bedtime.    [provider]  Omega-3 Fatty Acids (FISH OIL) 1000 MG CAPS Take 1,000 mg by mouth daily.  [provider]  omeprazole (PRILOSEC) 20 MG capsule Take 20 mg by mouth daily.    [provider]  oxycodone (ROXICODONE) 30 MG immediate release tablet Take 30 mg by mouth every 4 (four) hours as needed for pain.     [provider]    Allergies:   Patient has no known allergies.   Social History   Socioeconomic History  . Marital status: Married    Spouse name:  Not on file  . Number of children: Not on file  . Years of education: Not on file  . Highest education level: Not on file  Occupational History  . Not on file  Social Needs  . Financial resource strain: Not on file  . Food insecurity:    Worry: Not on file    Inability: Not on file  . Transportation needs:    Medical: Not on file    Non-medical: Not on file  Tobacco Use  . Smoking status: Current Every Day Smoker    Packs/day: 0.50    Years: 25.00    Pack years: 12.50  . Smokeless tobacco: Never Used  Substance and Sexual Activity  . Alcohol use: Yes    Alcohol/week: 2.0 standard drinks    Types: 2 Cans of beer per week  . Drug use: No  . Sexual activity: Not on file  Lifestyle  . Physical activity:    Days per week: Not on file    Minutes per session: Not on file  . Stress: Not on file  Relationships  . Social connections:    Talks on phone: Not on file    Gets together: Not on file    Attends religious service: Not on file    Active member of club or organization: Not on file    Attends meetings of clubs or organizations: Not on file    Relationship status: Not on file  Other Topics Concern  . Not on file  Social History Narrative  . Not on file     Family History:  The patient's family history includes Brain cancer in his brother and mother; Heart attack (age of onset: 59) in his father; Lung cancer in his father.   ROS:   Please see the history of present illness.    ROS All other systems reviewed and are negative.   PHYSICAL EXAM:   VS:  BP 122/90   Pulse 100   Ht 6\' 6"  (1.981 m)   Wt 273 lb (123.8 kg)   SpO2 98%   BMI 31.55 kg/m    GEN: Well nourished, well developed, in no acute distress  HEENT: normal  Neck: no JVD, carotid bruits, or masses Cardiac: RRR; 2/3 systolic murmurs, rubs, or gallops,no edema  Respiratory:  clear to auscultation bilaterally, normal work of breathing GI: soft, nontender, nondistended, + BS MS: no deformity or atrophy    Skin: warm and dry, no rash Neuro:  Alert and Oriented x 3, Strength and sensation are intact Psych: euthymic mood, full affect  Wt Readings from Last 3 Encounters:  10/14/17 273 lb (123.8 kg)  12/18/16 285 lb 9.6 oz (129.5 kg)  06/30/16 285 lb 12.8 oz (129.6 kg)      Studies/Labs Reviewed:   EKG:  EKG is not ordered today.  Recent Labs: 12/16/2016: BUN 6; Creatinine, Ser 0.77; Potassium 4.6; Sodium 135   Lipid Panel No results found for: CHOL, TRIG, HDL, CHOLHDL, VLDL, LDLCALC, LDLDIRECT  Additional studies/ records that were reviewed today include:  Echocardiogram: 10/2015 Study Conclusions  - Left ventricle: The cavity size was normal. [place] thickness was   increased in a pattern of mild LVH. Systolic function was normal.   The estimated ejection fraction was in the range of 55% to 60%.   Considine motion was normal; there were no regional Slinger motion   abnormalities. Doppler parameters are consistent with abnormal   left ventricular relaxation (grade 1 diastolic dysfunction). - Mitral valve: Calcified annulus.  Impressions:  - Normal LV systolic function; mild LVH; grade 1 diastolic   dysfunction; trace MR and TR.    ASSESSMENT & PLAN:    1. Chronic diastolic CHF - Euvolemic. Continue coerg and Lisinopril/HCTZ at current dose.   2. HTN - BP stable on current medications.   3. Tobacco smoking - Cessation recommended  4. Systolic murmur - Trace MR by echo in 2017. Essentially asymptomatic. Follow clinically.    Medication Adjustments/Labs and Tests Ordered: Current medicines are reviewed at length with the patient today.  Concerns regarding medicines are outlined above.  Medication changes, Labs and Tests ordered today are listed in the Patient Instructions below. Patient Instructions  Medication Instructions:  Your physician recommends that you continue on your current medications as directed. Please refer to the Current Medication list given to you  today.  Labwork: NONE  Testing/Procedures: NONE  Follow-Up: Your physician wants you to follow-up in: 12 months with Dr. Jacques Navy at [facility]. You will receive a reminder letter in the mail two months in advance. If you don't receive a letter, please call our office to schedule the follow-up appointment.   If you need a refill on your cardiac medications before your next appointment, please call your pharmacy.       Lorelei Pont, [place]  10/14/2017 11:05 AM    [facility] [address] [place], [place], [place]  [address] Phone: [phone]; Fax: [phone]

## 2017-10-14 NOTE — Patient Instructions (Addendum)
Medication Instructions:  Your physician recommends that you continue on your current medications as directed. Please refer to the Current Medication list given to you today.  Labwork: NONE  Testing/Procedures: NONE  Follow-Up: Your physician wants you to follow-up in: 12 months with Dr. Jacques Navy at [facility]. You will receive a reminder letter in the mail two months in advance. If you don't receive a letter, please call our office to schedule the follow-up appointment.   If you need a refill on your cardiac medications before your next appointment, please call your pharmacy.

## 2018-08-18 DIAGNOSIS — R221 Localized swelling, mass and lump, neck: Secondary | ICD-10-CM | POA: Insufficient documentation

## 2018-08-23 ENCOUNTER — Other Ambulatory Visit: Payer: Self-pay | Admitting: Neurosurgery

## 2018-08-23 ENCOUNTER — Telehealth: Payer: Self-pay | Admitting: Nurse Practitioner

## 2018-08-23 DIAGNOSIS — M961 Postlaminectomy syndrome, not elsewhere classified: Secondary | ICD-10-CM

## 2018-08-23 NOTE — Telephone Encounter (Signed)
Phone call to patient to verify medication list and allergies for myelogram procedure. Pt instructed to hold Nortriptyline for 48hrs prior to myelogram appointment time. Pt verbalized understanding. Pre and post procedure instructions reviewed with pt. 

## 2018-09-10 ENCOUNTER — Other Ambulatory Visit: Payer: Self-pay

## 2018-09-10 ENCOUNTER — Ambulatory Visit
Admission: RE | Admit: 2018-09-10 | Discharge: 2018-09-10 | Disposition: A | Discharge status: Home / Self Care | Payer: Medicare HMO | Source: Ambulatory Visit | Attending: Neurosurgery | Admitting: Neurosurgery

## 2018-09-10 VITALS — BP 107/66 | HR 107

## 2018-09-10 DIAGNOSIS — M961 Postlaminectomy syndrome, not elsewhere classified: Secondary | ICD-10-CM

## 2018-09-10 DIAGNOSIS — M5136 Other intervertebral disc degeneration, lumbar region: Secondary | ICD-10-CM

## 2018-09-10 MED ORDER — DIAZEPAM 5 MG PO TABS
10.0000 mg | ORAL_TABLET | Freq: Once | ORAL | Status: AC
  Administered 2018-09-10: 10 mg via ORAL

## 2018-09-10 MED ORDER — MEPERIDINE HCL 100 MG/ML IJ SOLN
50.0000 mg | Freq: Once | INTRAMUSCULAR | Status: AC
  Administered 2018-09-10: 50 mg via INTRAMUSCULAR

## 2018-09-10 MED ORDER — ONDANSETRON HCL 4 MG/2ML IJ SOLN
4.0000 mg | Freq: Once | INTRAMUSCULAR | Status: AC
  Administered 2018-09-10: 4 mg via INTRAMUSCULAR

## 2018-09-10 MED ORDER — IOPAMIDOL (ISOVUE-M 200) INJECTION 41%
20.0000 mL | Freq: Once | INTRAMUSCULAR | Status: AC
  Administered 2018-09-10: 20 mL via INTRATHECAL

## 2018-09-10 NOTE — Discharge Instructions (Signed)
Myelogram Discharge Instructions  1. Go home and rest quietly for the next 24 hours.  It is important to lie flat for the next 24 hours.  Get up only to go to the restroom.  You may lie in the bed or on a couch on your back, your stomach, your left side or your right side.  You may have one pillow under your head.  You may have pillows between your knees while you are on your side or under your knees while you are on your back.  2. DO NOT drive today.  Recline the seat as far back as it will go, while still wearing your seat belt, on the way home.  3. You may get up to go to the bathroom as needed.  You may sit up for 10 minutes to eat.  You may resume your normal diet and medications unless otherwise indicated.  Drink lots of extra fluids today and tomorrow.  4. The incidence of headache, nausea, or vomiting is about 5% (one in 20 patients).  If you develop a headache, lie flat and drink plenty of fluids until the headache goes away.  Caffeinated beverages may be helpful.  If you develop severe nausea and vomiting or a headache that does not go away with flat bed rest, call [phone].  5. You may resume normal activities after your 24 hours of bed rest is over; however, do not exert yourself strongly or do any heavy lifting tomorrow. If when you get up you have a headache when standing, go back to bed and force fluids for another 24 hours.  6. Call your physician for a follow-up appointment.  The results of your myelogram will be sent directly to your physician by the following day.  7. If you have any questions or if complications develop after you arrive home, please call [phone].  Discharge instructions have been explained to the patient.  The patient, or the person responsible for the patient, fully understands these instructions.  YOU MAY RESTART YOUR NORTRIPTYLINE (PAMELOR) TOMORROW 09/11/2018 AT 10:30AM.

## 2018-09-10 NOTE — Progress Notes (Signed)
Patient states he has been off Nortriptyline for at least the past two days.

## 2018-09-13 DIAGNOSIS — Z09 Encounter for follow-up examination after completed treatment for conditions other than malignant neoplasm: Secondary | ICD-10-CM | POA: Insufficient documentation

## 2018-10-26 ENCOUNTER — Other Ambulatory Visit: Payer: Self-pay | Admitting: Physician Assistant

## 2018-10-26 DIAGNOSIS — I10 Essential (primary) hypertension: Secondary | ICD-10-CM

## 2018-10-26 DIAGNOSIS — L97921 Non-pressure chronic ulcer of unspecified part of left lower leg limited to breakdown of skin: Secondary | ICD-10-CM | POA: Insufficient documentation

## 2018-10-26 DIAGNOSIS — L97911 Non-pressure chronic ulcer of unspecified part of right lower leg limited to breakdown of skin: Secondary | ICD-10-CM | POA: Insufficient documentation

## 2018-10-26 DIAGNOSIS — I890 Lymphedema, not elsewhere classified: Secondary | ICD-10-CM | POA: Insufficient documentation

## 2018-11-03 DIAGNOSIS — E871 Hypo-osmolality and hyponatremia: Secondary | ICD-10-CM | POA: Insufficient documentation

## 2018-11-04 DIAGNOSIS — N179 Acute kidney failure, unspecified: Secondary | ICD-10-CM | POA: Insufficient documentation

## 2018-11-23 ENCOUNTER — Other Ambulatory Visit: Payer: Self-pay | Admitting: Physician Assistant

## 2018-11-23 DIAGNOSIS — I10 Essential (primary) hypertension: Secondary | ICD-10-CM

## 2018-11-24 ENCOUNTER — Telehealth: Payer: Self-pay

## 2018-11-24 NOTE — Telephone Encounter (Signed)
Virtual Visit Pre-Appointment Phone Call  "(Name), I am calling you today to discuss your upcoming appointment. We are currently trying to limit exposure to the virus that causes COVID-19 by seeing patients at home rather than in the office."  1. "What is the BEST phone number to call the day of the visit?" - include this in appointment notes  2. "Do you have or have access to (through a family member/friend) a smartphone with video capability that we can use for your visit?" a. If yes - list this number in appt notes as "cell" (if different from BEST phone #) and list the appointment type as a VIDEO visit in appointment notes b. If no - list the appointment type as a PHONE visit in appointment notes  3. Confirm consent - "In the setting of the current Covid19 crisis, you are scheduled for a (phone or video) visit with your provider on (date) at (time).  Just as we do with many in-office visits, in order for you to participate in this visit, we must obtain consent.  If you'd like, I can send this to your mychart (if signed up) or email for you to review.  Otherwise, I can obtain your verbal consent now.  All virtual visits are billed to your insurance company just like a normal visit would be.  By agreeing to a virtual visit, we'd like you to understand that the technology does not allow for your provider to perform an examination, and thus may limit your provider's ability to fully assess your condition. If your provider identifies any concerns that need to be evaluated in person, we will make arrangements to do so.  Finally, though the technology is pretty good, we cannot assure that it will always work on either your or our end, and in the setting of a video visit, we may have to convert it to a phone-only visit.  In either situation, we cannot ensure that we have a secure connection.  Are you willing to proceed?" STAFF: Did the patient verbally acknowledge consent to telehealth visit? Document  YES/NO here: yes  4. Advise patient to be prepared - "Two hours prior to your appointment, go ahead and check your blood pressure, pulse, oxygen saturation, and your weight (if you have the equipment to check those) and write them all down. When your visit starts, your provider will ask you for this information. If you have an Apple Watch or Kardia device, please plan to have heart rate information ready on the day of your appointment. Please have a pen and paper handy nearby the day of the visit as well."  5. Give patient instructions for MyChart download to smartphone OR Doximity/Doxy.me as below if video visit (depending on what platform provider is using)  6. Inform patient they will receive a phone call 15 minutes prior to their appointment time (may be from unknown caller ID) so they should be prepared to answer    TELEPHONE CALL NOTE  Tyler Wood has been deemed a candidate for a follow-up tele-health visit to limit community exposure during the Covid-19 pandemic. I spoke with the patient via phone to ensure availability of phone/video source, confirm preferred email & phone number, and discuss instructions and expectations.  I reminded Tyler Wood to be prepared with any vital sign and/or heart rhythm information that could potentially be obtained via home monitoring, at the time of his visit. I reminded Tyler Wood to expect a phone call prior to  his visit.  Clide Dales Cornelius Marullo, CMA 11/24/2018 4:09 PM   INSTRUCTIONS FOR DOWNLOADING THE MYCHART APP TO SMARTPHONE  - The patient must first make sure to have activated MyChart and know their login information - If Apple, go to [employer] and type in MyChart in the search bar and download the app. If Android, ask patient to go to [employer] and type in [place] in the search bar and download the app. The app is free but as with any other app downloads, their phone may require them to verify saved payment information or  Apple/Android password.  - The patient will need to then log into the app with their MyChart username and password, and select [facility] as their healthcare provider to link the account. When it is time for your visit, go to the MyChart app, find appointments, and click Begin Video Visit. Be sure to Select Allow for your device to access the Microphone and Camera for your visit. You will then be connected, and your provider will be with you shortly.  **If they have any issues connecting, or need assistance please contact MyChart service desk [phone])**  **If using a computer, in order to ensure the best quality for their visit they will need to use either of the following Internet Browsers: [employer] or Google Chrome**  IF USING DOXIMITY or DOXY.ME - The patient will receive a link just prior to their visit by text.     FULL LENGTH CONSENT FOR TELE-HEALTH VISIT   I hereby voluntarily request, consent and authorize [facility] and its employed or contracted physicians, physician assistants, nurse practitioners or other licensed health care professionals (the Practitioner), to provide me with telemedicine health care services (the "Services") as deemed necessary by the treating Practitioner. I acknowledge and consent to receive the Services by the Practitioner via telemedicine. I understand that the telemedicine visit will involve communicating with the Practitioner through live audiovisual communication technology and the disclosure of certain medical information by electronic transmission. I acknowledge that I have been given the opportunity to request an in-person assessment or other available alternative prior to the telemedicine visit and am voluntarily participating in the telemedicine visit.  I understand that I have the right to withhold or withdraw my consent to the use of telemedicine in the course of my care at any time, without affecting my right to future care  or treatment, and that the Practitioner or I may terminate the telemedicine visit at any time. I understand that I have the right to inspect all information obtained and/or recorded in the course of the telemedicine visit and may receive copies of available information for a reasonable fee.  I understand that some of the potential risks of receiving the Services via telemedicine include:  Marland Kitchen Delay or interruption in medical evaluation due to technological equipment failure or disruption; . Information transmitted may not be sufficient (e.g. poor resolution of images) to allow for appropriate medical decision making by the Practitioner; and/or  . In rare instances, security protocols could fail, causing a breach of personal health information.  Furthermore, I acknowledge that it is my responsibility to provide information about my medical history, conditions and care that is complete and accurate to the best of my ability. I acknowledge that Practitioner's advice, recommendations, and/or decision may be based on factors not within their control, such as incomplete or inaccurate data provided by me or distortions of diagnostic images or specimens that may result from electronic transmissions.  I understand that the practice of medicine is not an exact science and that Practitioner makes no warranties or guarantees regarding treatment outcomes. I acknowledge that I will receive a copy of this consent concurrently upon execution via email to the email address I last provided but may also request a printed copy by calling the office of [facility].    I understand that my insurance will be billed for this visit.   I have read or had this consent read to me. . I understand the contents of this consent, which adequately explains the benefits and risks of the Services being provided via telemedicine.  . I have been provided ample opportunity to ask questions regarding this consent and the Services and have had  my questions answered to my satisfaction. . I give my informed consent for the services to be provided through the use of telemedicine in my medical care  By participating in this telemedicine visit I agree to the above.

## 2018-11-25 ENCOUNTER — Encounter: Payer: Self-pay | Admitting: Physician Assistant

## 2018-11-25 NOTE — Progress Notes (Signed)
Virtual Visit via Telephone Note   This visit type was conducted due to national recommendations for restrictions regarding the COVID-19 Pandemic (e.g. social distancing) in an effort to limit this patient's exposure and mitigate transmission in our community.  Due to his co-morbid illnesses, this patient is at least at moderate risk for complications without adequate follow up.  This format is felt to be most appropriate for this patient at this time.  The patient did not have access to video technology/had technical difficulties with video requiring transitioning to audio format only (telephone).  All issues noted in this document were discussed and addressed.  No physical exam could be performed with this format.  Please refer to the patient's chart for his  consent to telehealth for [facility]. The patient was offered a virtual versus in person office visit in an effort to limit physical exposure during the Covid-19 pandemic and they elected to proceed with virtual visit.  Date:  11/29/2018   ID:  Tyler Wood, DOB [birth year], MRN [id number]  Patient Location: Home Provider Location: Home  PCP:  Toma Aran, PA-C  Cardiologist: Previously  Nelva Bush, MD before transition to [facility] clinic - will need to establish with [facility] MD Electrophysiologist:  None   Evaluation Performed:  Follow-Up Visit  Chief Complaint:  F/u lower extremity edema  History of Present Illness:    Tyler Wood is a 53 y.o. male with lower extremity edema, hypertension, type 2 diabetes mellitus, GERD, obstructive sleep apnea on CPAP, tobacco use, alcohol abuse, chronic appearing sinus tachycardia, abnormal LFTs and low back pain status post lumbar fusion and spinal cord stimulator, who presents for follow-up of leg swelling.  He was initially evaluated for lower extremity edema in 2017, occurring in the setting of being relatively sedentary due to long-standing back pain. Lower extremity duplex  11/2015 was negative for DVT. 2D echocardiogram 10/2015 showed normal LV size with mild LVH, LVEF 08-65%, grade 1 diastolic dysfunction, mitral annular calcification with trace MR, trace TR. Last labs in Epic from 12/2016 showed K 4.6, Cr 0.77, 2017 normal CBC/TSH. He appears to have been recently admitted in late October with confusion and falls felt due to symptomatic hyponatremia (Na 107), also found to have abnormal LFTs and hypokalemia. He was encephalopathic. He reported drinking 2-4 bottles of water a day and 6-8 12oz beers daily (although refutes this amount currently). His Lasix had recently been increased to TID per their notes, does not appear this change came through our office. He was seen by nephrology and treated with hypertonic saline, tolvaptan, and salt tablets. He was also found to have possible pneumonia. He apparently left AMA. Lasix was stopped. He was discharged on salt tablets and KCl. His last hospital Na 10/2018 was 129, otherwise with Cr 0.74, Mg 1.7, LDL 80, TSH wnl, albumin 3.3 with elevated AST/ALT.  He is seen back virtually for follow-up and reports doing well. He says that ever since he switched gabapentin to Lyrica a while back his lower extremity edema improved. It remains very mild. During hospitalization this was reported as trace. He denies any CP, palpitations, SOB. His HR remains elevated, but he states this is chronic for him - was 112 several years ago even, baseline appearing 99-110. HR was similar in the hospital and reported to be regular rate/rhythm. He was unaware of abnormal liver function tests. He appears to be back on Lasix again. He states his primary care restarted this, but then goes on to say  he's not sure if he actually saw them before or after his recent hospitalization. He says he doesn't see them again until March. He is taking salt tablets as directed (x 30 days per rx) and potassium.   The patient does not have symptoms concerning for COVID-19  infection (fever, chills, cough, or new shortness of breath).    Past Medical History:  Diagnosis Date   Alcohol abuse    Arthritis    back   Chronic back pain    Diabetes mellitus without complication (HCC)    GERD (gastroesophageal reflux disease)    Hypertension    Hyponatremia    Lower extremity edema    Sleep apnea    Uses CPAP   Tobacco abuse    Past Surgical History:  Procedure Laterality Date   BACK SURGERY     Fused on Feb. 17, 2016   BONE BIOPSY Right 12/18/2016   Procedure: Bone Biopsy and Cultures;  Surgeon: Toni ArthursHewitt, John, MD;  Location: [facility];  Service: Orthopedics;  Laterality: Right;   CHEILECTOMY Right 12/18/2016   Procedure: Right Hallux Metatarsophalangeal Joint Cheilectomy;  Surgeon: Toni ArthursHewitt, John, MD;  Location: [facility];  Service: Orthopedics;  Laterality: Right;   FORAMINOTOMY 1 LEVEL  01/12/2015   L5 S1   LUMBAR LAMINECTOMY/DECOMPRESSION MICRODISCECTOMY Right 01/12/2015   Procedure: right Lumbar five -sacral one Foraminotomy;  Surgeon: Hilda LiasErnesto Botero, MD;  Location: [facility];  Service: Neurosurgery;  Laterality: Right;   SPINAL CORD STIMULATOR INSERTION N/A 07/20/2015   Procedure: LUMBAR SPINAL CORD STIMULATOR INSERTION;  Surgeon: Odette FractionPaul Harkins, MD;  Location: [facility];  Service: Neurosurgery;  Laterality: N/A;  Lumbar/thoracic spine     Current Meds  Medication Sig   atorvastatin (LIPITOR) 20 MG tablet Take 20 mg by mouth daily.   carvedilol (COREG) 6.25 MG tablet Take 1 tablet (6.25 mg total) by mouth 2 (two) times daily with a meal.   furosemide (LASIX) 20 MG tablet TAKE 1 TABLET(20 MG) BY MOUTH DAILY   lisinopril-hydrochlorothiazide (PRINZIDE,ZESTORETIC) 20-12.5 MG tablet Take 1 tablet by mouth daily.   LYRICA 150 MG capsule Take 150 mg by mouth 3 (three) times daily.   metFORMIN (GLUCOPHAGE) 500 MG tablet Take 500 mg by mouth 2 (two) times daily.   Multiple Vitamins-Minerals  (MULTIVITAMIN WITH MINERALS) tablet Take 1 tablet by mouth daily.   nortriptyline (PAMELOR) 25 MG capsule Take 1 capsule by mouth 2 (two) times daily.   Omega-3 Fatty Acids (FISH OIL) 1000 MG CAPS Take 1,000 mg by mouth daily.   omeprazole (PRILOSEC) 20 MG capsule Take 20 mg by mouth daily.   sodium chloride 1 gram tablet   1 tablet (1,000 mg total) by mouth 3 times daily for 30 days   KLOR-CON M10 10 mEq extended release Take 10 mEq by mouth daily     oxycodone (ROXICODONE) 30 MG immediate release tablet Take 1 tablet by mouth 4 (four) times daily.     Allergies:   Patient has no known allergies.   Social History   Tobacco Use   Smoking status: Current Every Day Smoker    Packs/day: 0.50    Years: 25.00    Pack years: 12.50   Smokeless tobacco: Never Used  Substance Use Topics   Alcohol use: Yes    Alcohol/week: 2.0 standard drinks    Types: 2 Cans of beer per week   Drug use: No     Family Hx: The patient's family history includes Brain cancer  in his brother and mother; Heart attack (age of onset: 2) in his father; Lung cancer in his father.  ROS:   Please see the history of present illness.    All other systems reviewed and are negative.   Prior CV studies:    Most recent pertinent cardiac studies are outlined above.  Labs/Other Tests and Data Reviewed:    EKG:  An ECG dated 12/16/16 was personally reviewed today and demonstrated:  NSR 88bpm, no acute STT changes  Recent Labs: No results found for requested labs within last 8760 hours.   Recent Lipid Panel No results found for: CHOL, TRIG, HDL, CHOLHDL, LDLCALC, LDLDIRECT  Wt Readings from Last 3 Encounters:  11/29/18 270 lb (122.5 kg)  10/14/17 273 lb (123.8 kg)  12/18/16 285 lb 9.6 oz (129.5 kg)     Objective:    Vital Signs:  BP 124/80    Pulse (!) 114    Ht 6\' 6"  (1.981 m)    Wt 270 lb (122.5 kg)    BMI 31.20 kg/m    VS reviewed. General - pleasant calm M in no acute distress Pulm - No  labored breathing, no coughing during visit, no audible wheezing, speaking in full sentences Neuro - A+Ox3, no slurred speech, answers questions appropriately Psych - Pleasant affect    ASSESSMENT & PLAN:    1. Lower extremity edema - patient reports good control of edema ever since changing neuropathic medications. Of concern however is that he's not sure if he has seen primary care since hospitalization for his severe hyponatremia. He is back on Lasix because he thinks they restarted it. He just can't recall if he saw PCP before or after this hospitalization. He did leave AMA. He is also on low dose HCTZ in combination with lisinopril. I feel he needs close primary care followup for this given the degree of low sodium he had and his lack of clarity on the plan. I will have our office contact his primary care to encourage close ongoing f/u since they have primarily driven his diuretic dosing. He lives near [facility] and states he prefers to f/u closer to home. 2. Essential HTN - controlled, follow with med change as listed below. 3. OSA - he reports compliance with CPAP. He prefers to discuss ongoing monitoring with primary care. 4. Tobacco abuse - counseled on cessation of this as well as ETOH. 5. Hyponatremia - as above. 6. Sinus tachycardia - chronic problem for patient. As above, will have nurse reach out to PCP's office to encourage ongoing f/u with their office - he lives at [facility] and prefers to follow up with them closer to home. Will ask nurse to request they obtain an EKG and send to [facility] for review. Recent TSH was normal and hospitalization notes did not note any new arrhythmias. Per d/w the patient, will change carvedilol to metoprolol tartrate 50mg  BID with planned f/u in our office in 3 months.   COVID-19 Education: The signs and symptoms of COVID-19 were discussed with the patient and how to seek care for testing (follow up with PCP or arrange E-visit).  The importance of  social distancing was discussed today.  Time:   Today, I have spent 20 minutes with the patient with telehealth technology discussing the above problems and additional time reviewing records from OSH.   Medication Adjustments/Labs and Tests Ordered: Current medicines are reviewed at length with the patient today.  Concerns regarding medicines are outlined above.  Disposition:  Follow up in 3 months in our office - he previously saw Dr. Okey Dupre who has since moved to [place] only. He will need to establish with a new cardiologist in our [place] location due to distance.  Signed, Laurann Montana, PA-C  11/29/2018 9:02 AM    [facility]

## 2018-11-29 ENCOUNTER — Telehealth: Payer: Self-pay | Admitting: Physician Assistant

## 2018-11-29 ENCOUNTER — Telehealth (INDEPENDENT_AMBULATORY_CARE_PROVIDER_SITE_OTHER): Payer: Medicare HMO | Admitting: Physician Assistant

## 2018-11-29 ENCOUNTER — Other Ambulatory Visit: Payer: Self-pay

## 2018-11-29 ENCOUNTER — Encounter: Payer: Self-pay | Admitting: Physician Assistant

## 2018-11-29 VITALS — BP 124/80 | HR 114 | Ht 78.0 in | Wt 270.0 lb

## 2018-11-29 DIAGNOSIS — I10 Essential (primary) hypertension: Secondary | ICD-10-CM

## 2018-11-29 DIAGNOSIS — R600 Localized edema: Secondary | ICD-10-CM

## 2018-11-29 DIAGNOSIS — Z720 Tobacco use: Secondary | ICD-10-CM

## 2018-11-29 DIAGNOSIS — R000 Tachycardia, unspecified: Secondary | ICD-10-CM

## 2018-11-29 DIAGNOSIS — G4733 Obstructive sleep apnea (adult) (pediatric): Secondary | ICD-10-CM

## 2018-11-29 DIAGNOSIS — E871 Hypo-osmolality and hyponatremia: Secondary | ICD-10-CM

## 2018-11-29 MED ORDER — METOPROLOL TARTRATE 50 MG PO TABS: 50.0000 mg | ORAL_TABLET | Freq: Two times a day (BID) | ORAL | 2 refills | Status: DC

## 2018-11-29 NOTE — Telephone Encounter (Signed)
   Enis Slipper, RN, was able to speak to patient's PCP's office who stated he has not followed up since hospitalization. Per her staff message: "Got the office. He has not followed up since hospitalization. I told office he should be contacting them ( I asked pt to check on follow up when I spoke with him). I asked office to follow up on LFT's and do EKG. I gave them fax number so EKG could be sent to our office."  Based on this information, I would recommend we try to see how soon primary care can see him as he needs recheck of his sodium level ASAP. He self-resumed Lasix even though this was discontinued during recent hospital stay because he had dangerously low sodium level. Based on their hospital notes I worry about his baseline cognitive ability. Primary care follow-up is definitely in order. I'd recommend he stop Lasix until he sees them back in follow-up. Thank you!  [place]na

## 2018-11-29 NOTE — Telephone Encounter (Signed)
I spoke with pt. He received a call from primary care today and is scheduled for appointment tomorrow at 9:00.  Pt verbalizes understanding of need to have sodium level checked and will have this done at visit tomorrow. I instructed him to stop furosemide and to follow instructions from primary care as to when or if this should be resumed.

## 2018-11-29 NOTE — Patient Instructions (Signed)
Medication Instructions:  Your physician has recommended you make the following change in your medication: Stop Carvedilol. Start metoprolol tartrate 50 mg by mouth twice daily.   *If you need a refill on your cardiac medications before your next appointment, please call your pharmacy*  Lab Work: None.   If you have labs (blood work) drawn today and your tests are completely normal, you will receive your results only by: Marland Kitchen MyChart Message (if you have MyChart) OR . A paper copy in the mail If you have any lab test that is abnormal or we need to change your treatment, we will call you to review the results.  Testing/Procedures: none  Follow-Up: At [facility], you and your health needs are our priority.  As part of our continuing mission to provide you with exceptional heart care, we have created designated Provider Care Teams.  These Care Teams include your primary Cardiologist (physician) and Advanced Practice Providers (APPs -  Physician Assistants and Nurse Practitioners) who all work together to provide you with the care you need, when you need it.  Your next appointment:   3 months--Scheduled for February 24,2021 at 10:00  The format for your next appointment:   In Person  Provider:   Dr Acie Fredrickson  Other Instructions Make sure you follow up soon with your primary care provider.  Have EKG done at this visit and faxed to our office for review--FAX is [phone].  Please talk to your primary care provider about following your sleep apnea.

## 2018-11-29 NOTE — Telephone Encounter (Signed)
Thank you :)

## 2019-03-09 ENCOUNTER — Ambulatory Visit: Payer: Medicare HMO | Admitting: Cardiovascular Disease

## 2019-03-22 ENCOUNTER — Ambulatory Visit: Payer: Medicare HMO | Admitting: Cardiovascular Disease

## 2019-08-23 ENCOUNTER — Other Ambulatory Visit: Payer: Self-pay | Admitting: Physician Assistant

## 2019-11-21 ENCOUNTER — Other Ambulatory Visit: Payer: Self-pay | Admitting: Physician Assistant

## 2019-11-22 ENCOUNTER — Other Ambulatory Visit: Payer: Self-pay

## 2019-11-22 DIAGNOSIS — I878 Other specified disorders of veins: Secondary | ICD-10-CM

## 2019-11-30 ENCOUNTER — Encounter (HOSPITAL_COMMUNITY): Payer: Medicare HMO

## 2020-01-04 ENCOUNTER — Other Ambulatory Visit: Payer: Self-pay | Admitting: Physician Assistant

## 2020-01-04 DIAGNOSIS — I10 Essential (primary) hypertension: Secondary | ICD-10-CM

## 2020-01-27 IMAGING — XA DG MYELOGRAPHY LUMBAR INJ LUMBOSACRAL
7 of 18 series · 7 of 18 positions shown · non-contrast
Comparison: Lumbar myelogram 07/03/2016

CLINICAL DATA: Lumbar post-laminectomy syndrome. Low back pain into
the lower extremities bilaterally.
TECHNIQUE: Contiguous axial images were obtained through the Lumbar spine after
the intrathecal infusion of infusion. Coronal and sagittal
reconstructions were obtained of the axial image sets.

[Series 1: vasc adipose · 1 of 1 slices shown (1 of 4)]
[im 1/1]
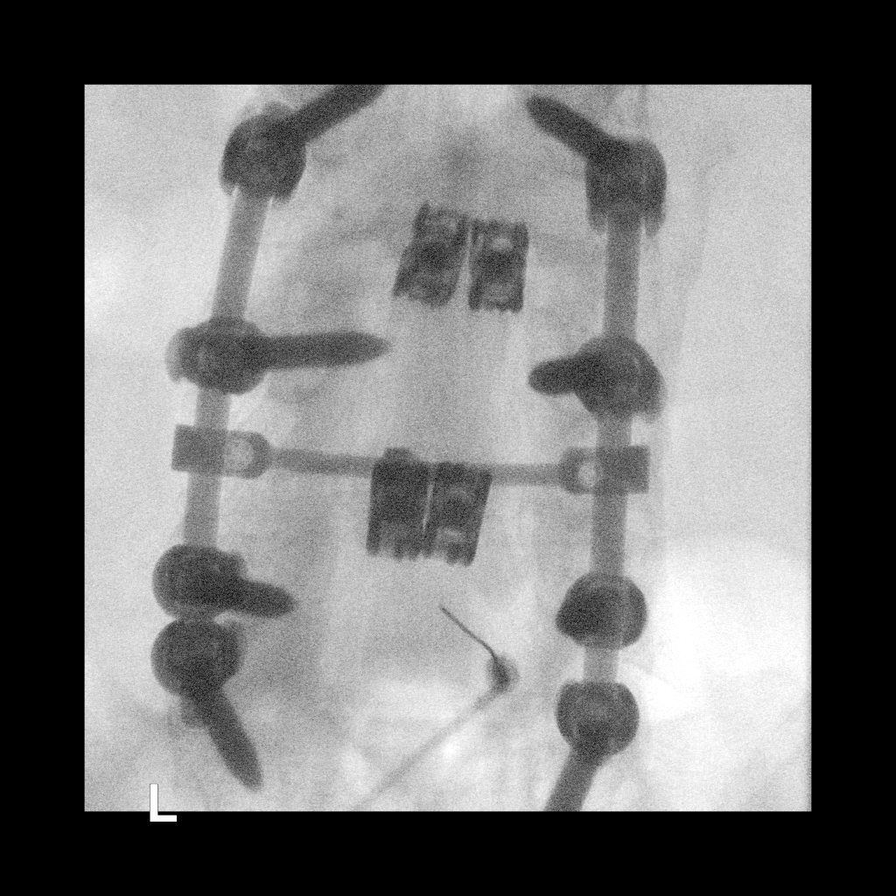

[Series 1: w lumbar spine lat · 0.15mm/px · 1 of 1 slices shown]
[im 1/1]
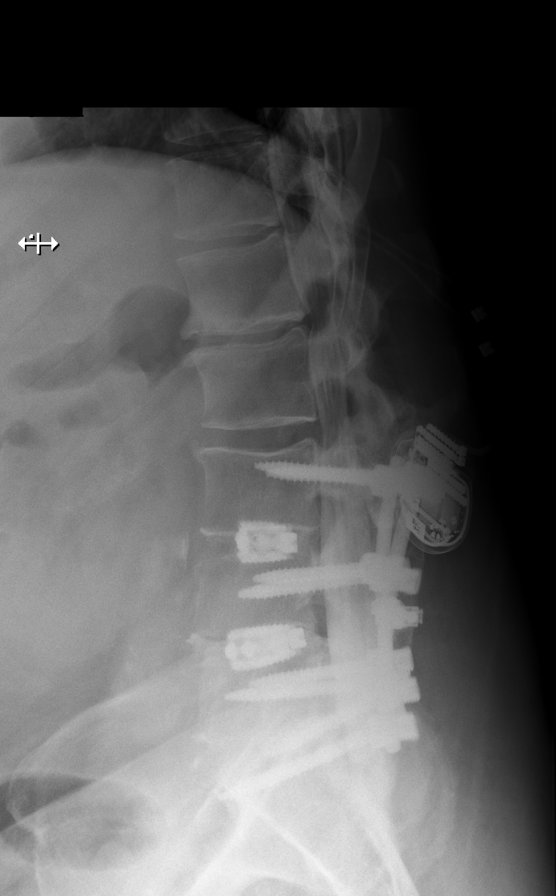

[Series 2: w lumbar spine flexion · 0.15mm/px · 1 of 1 slices shown]
[im 1/1]
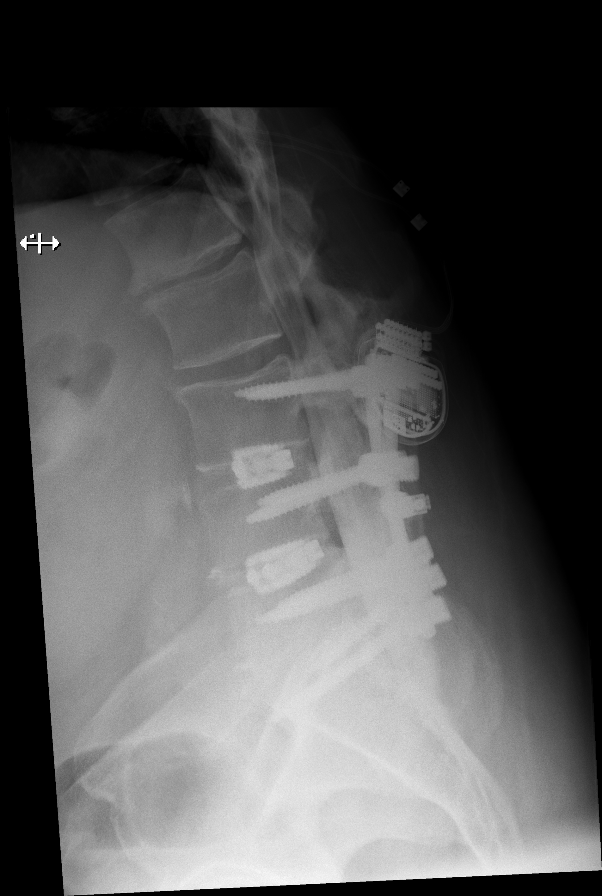

[Series 2: vasc adipose · 1 of 1 slices shown (2 of 4)]
[im 1/1]
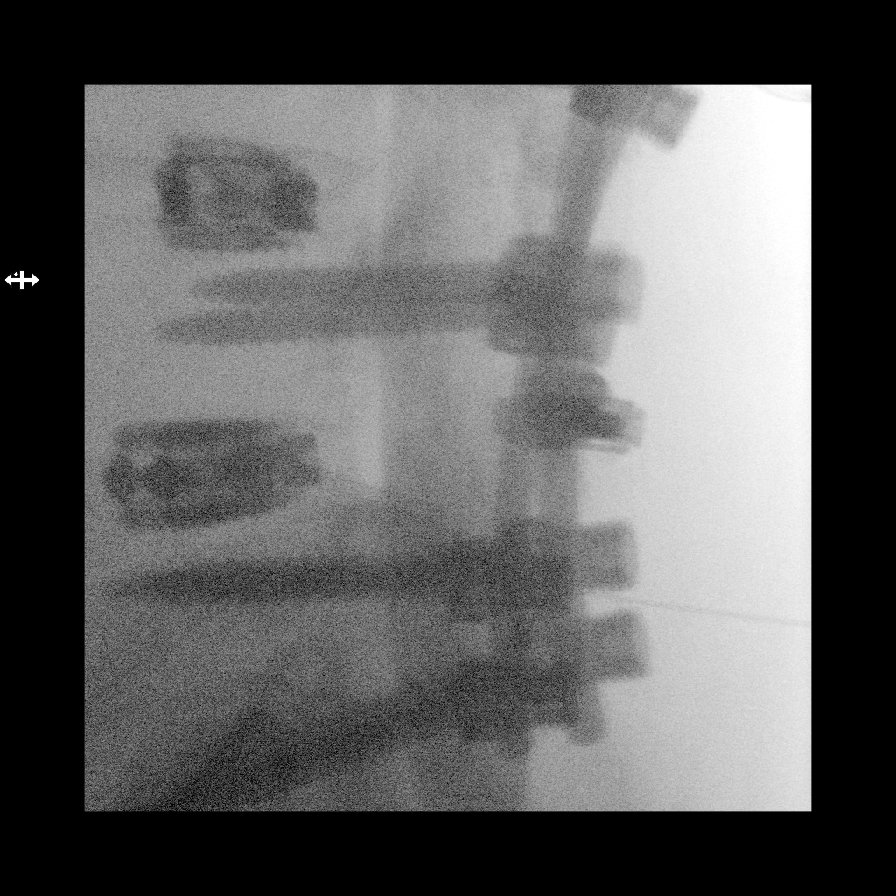

[Series 3: vasc adipose · 1 of 1 slices shown (3 of 4)]
[im 1/1]
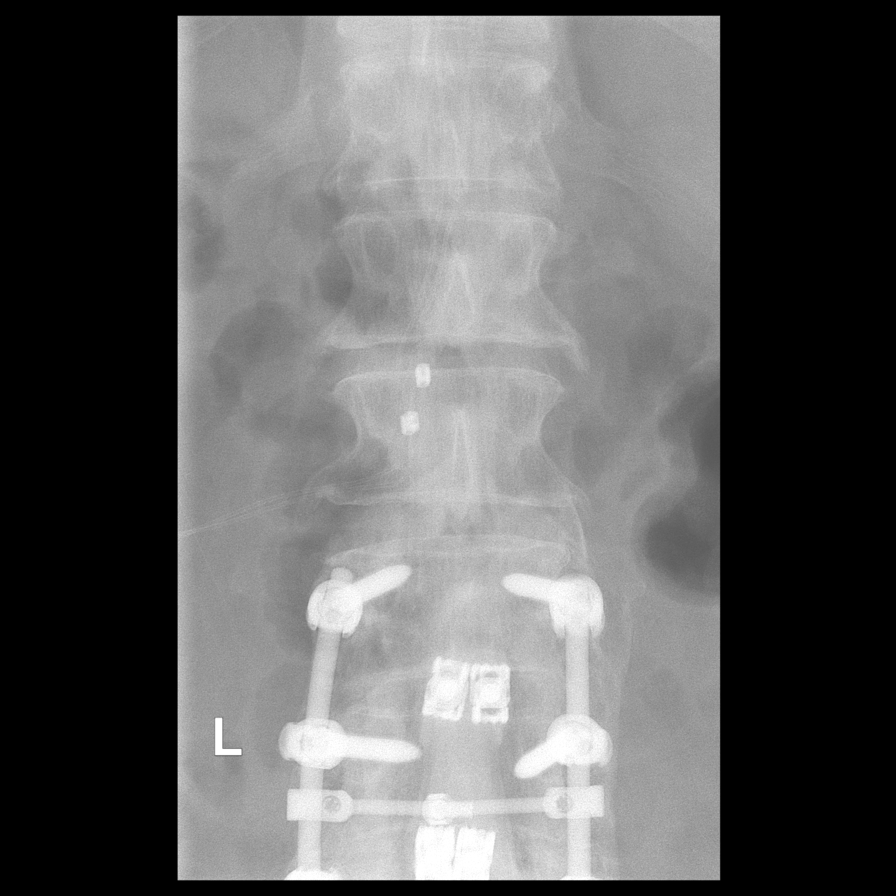

[Series 3: w lumbar spine extension · 0.15mm/px · 1 of 1 slices shown]
[im 1/1]
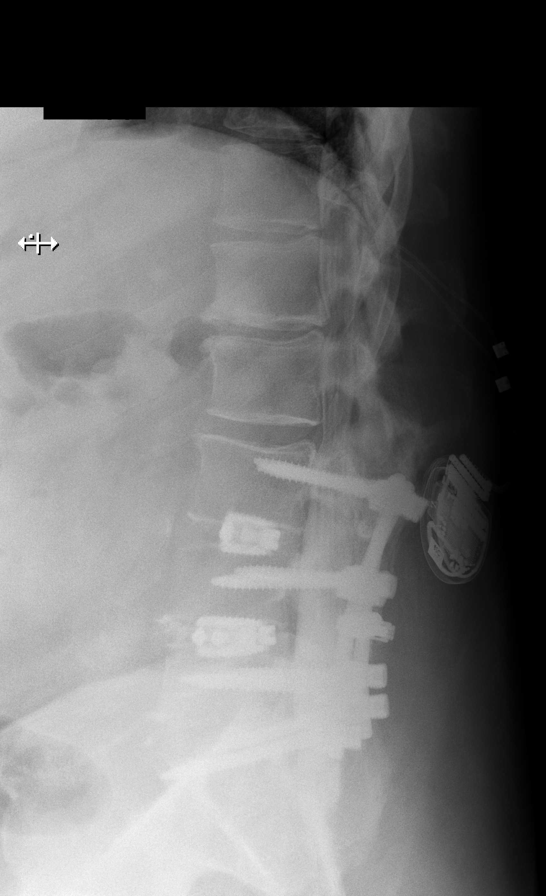

[Series 4: vasc adipose · 1 of 1 slices shown (4 of 4)]
[im 1/1]
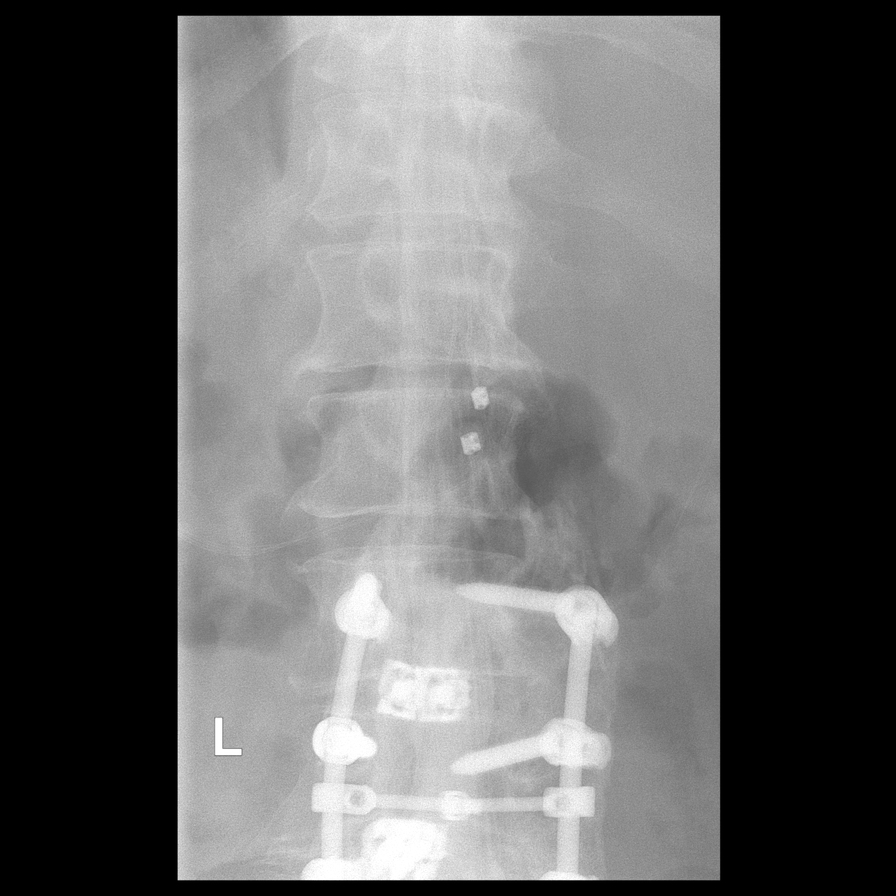

[7 of 18 positions shown; findings below may reference images not displayed]

EXAM:
LUMBAR MYELOGRAM

FLUOROSCOPY TIME:  Radiation Exposure Index (as provided by the
fluoroscopic device): 484.75 uGy*m2

PROCEDURE:
After thorough discussion of risks and benefits of the procedure
including bleeding, infection, injury to nerves, blood vessels,
adjacent structures as well as headache and CSF leak, written and
oral informed consent was obtained. Consent was obtained by Dr.
Zeinab Tiger. Time out form was completed.

Patient was positioned prone on the fluoroscopy table. Local
anesthesia was provided with 1% lidocaine without epinephrine after
prepped and draped in the usual sterile fashion. Puncture was
performed at L5 using a 3 1/2 inch 22-gauge spinal needle via
midline approach. Using a single pass through the dura, the needle
was placed within the thecal sac, with return of clear CSF. 15 mL of
Isovue 9-9HH was injected into the thecal sac, with normal
opacification of the nerve roots and cauda equina consistent with
free flow within the subarachnoid space.

I personally performed the lumbar puncture and administered the
intrathecal contrast. I also personally supervised acquisition of
the myelogram images.
FINDINGS: LUMBAR MYELOGRAM FINDINGS:

Five non rib-bearing lumbar type vertebral bodies are present. There
is some straightening of normal lumbar lordosis. Lumbar fusion is
again noted at L3-4, L4-5, and L5-S1. Hardware is intact. Lower
lumbar nerve roots are stable. There is early truncation right L4
nerve root. There is early truncation of the L5 nerve roots
bilaterally. Lumbar nerve roots otherwise fill normally. There is
progressive loss of disc height at L1-2 with anterior Schmorl's
node. Broad-based disc protrusion is more prominent than on the
prior study. There is no significant change at the L2-3 level.

No abnormal motion is present during flexion or extension. Cord
stimulator is again noted.

CT LUMBAR MYELOGRAM FINDINGS:

Lumbar spine is imaged from T11 through S2-3. There is straightening
of the normal lumbar lordosis. No significant listhesis is present.

Atherosclerotic changes are again noted within the abdominal aorta.
There is no aneurysm. No solid organ lesions are present.

Solid fusion is again noted at L3-4, L4-5, and L5-S1.

Osseous foraminal narrowing at L5-S1 is stable bilaterally. No
residual recurrent stenosis is present otherwise at L3-4, L4-5, or
L5-S1. Wide laminectomies are noted at each of these levels.

L2-3: Marked facet hypertrophy is present. A broad-based disc
protrusion is present. There is no significant interval change. No
significant stenosis present.

L1-2: Progressive endplate changes are present with anterior
Schmorl's node. There is progressive broad-based disc protrusion
with now mild central canal narrowing. Progressive facet hypertrophy
contributes to progressive moderate foraminal narrowing.
IMPRESSION: 1. Stable lumbar fusion at L3-4, L4-5, and L5-S1.
2. Stable osseous narrowing bilaterally at L5-S1.
3. Progressive mild central and moderate bilateral foraminal
stenosis at L1-2.
4. Stable mild adjacent level disease at L2-3 without significant
stenosis.

## 2021-02-13 DEATH — deceased
